# Patient Record
Sex: Female | Born: 1950
Health system: Southern US, Community
[De-identification: ages and names within clinical notes are randomized; demographics above are authoritative.]

## PROBLEM LIST (undated history)

## (undated) DIAGNOSIS — Z0000 Encounter for general adult medical examination without abnormal findings: Secondary | ICD-10-CM

## (undated) DIAGNOSIS — M1710 Unilateral primary osteoarthritis, unspecified knee: Secondary | ICD-10-CM

## (undated) DIAGNOSIS — R0609 Other forms of dyspnea: Secondary | ICD-10-CM

## (undated) DIAGNOSIS — I10 Essential (primary) hypertension: Secondary | ICD-10-CM

## (undated) DIAGNOSIS — Z1211 Encounter for screening for malignant neoplasm of colon: Secondary | ICD-10-CM

## (undated) HISTORY — DX: Encounter for screening for malignant neoplasm of colon: Z12.11

## (undated) HISTORY — PX: TUBAL LIGATION: SHX77

## (undated) HISTORY — DX: Encounter for general adult medical examination without abnormal findings: Z00.00

## (undated) HISTORY — DX: Other forms of dyspnea: R06.09

## (undated) HISTORY — PX: WISDOM TOOTH EXTRACTION: SHX21

## (undated) HISTORY — DX: Essential (primary) hypertension: I10

## (undated) HISTORY — DX: Unilateral primary osteoarthritis, unspecified knee: M17.10

---

## 2001-01-06 ENCOUNTER — Other Ambulatory Visit: Admission: RE | Admit: 2001-01-06 | Discharge: 2001-01-06 | Payer: Self-pay | Admitting: *Deleted

## 2003-09-29 ENCOUNTER — Inpatient Hospital Stay (HOSPITAL_COMMUNITY): Admission: AD | Admit: 2003-09-29 | Discharge: 2003-09-29 | Payer: Self-pay | Admitting: *Deleted

## 2011-09-17 ENCOUNTER — Other Ambulatory Visit: Payer: Self-pay | Admitting: Obstetrics and Gynecology

## 2011-09-17 DIAGNOSIS — Z1231 Encounter for screening mammogram for malignant neoplasm of breast: Secondary | ICD-10-CM

## 2011-09-19 ENCOUNTER — Ambulatory Visit (HOSPITAL_COMMUNITY): Payer: Self-pay | Attending: Obstetrics and Gynecology

## 2011-09-19 ENCOUNTER — Ambulatory Visit: Payer: Self-pay

## 2011-09-24 ENCOUNTER — Encounter: Payer: Self-pay | Admitting: Obstetrics and Gynecology

## 2012-02-10 ENCOUNTER — Other Ambulatory Visit: Payer: Self-pay | Admitting: Obstetrics and Gynecology

## 2012-02-10 DIAGNOSIS — Z1231 Encounter for screening mammogram for malignant neoplasm of breast: Secondary | ICD-10-CM

## 2012-02-23 ENCOUNTER — Encounter (HOSPITAL_COMMUNITY): Payer: Self-pay | Admitting: *Deleted

## 2012-03-02 ENCOUNTER — Ambulatory Visit (HOSPITAL_COMMUNITY): Payer: Self-pay

## 2012-03-16 ENCOUNTER — Encounter (HOSPITAL_COMMUNITY): Payer: Self-pay | Admitting: *Deleted

## 2012-03-23 ENCOUNTER — Ambulatory Visit (HOSPITAL_COMMUNITY): Payer: Self-pay | Attending: Obstetrics and Gynecology

## 2012-03-23 ENCOUNTER — Ambulatory Visit (HOSPITAL_COMMUNITY): Payer: Self-pay

## 2012-08-27 ENCOUNTER — Other Ambulatory Visit: Payer: Self-pay | Admitting: Obstetrics and Gynecology

## 2012-08-27 DIAGNOSIS — Z1231 Encounter for screening mammogram for malignant neoplasm of breast: Secondary | ICD-10-CM

## 2012-09-21 ENCOUNTER — Encounter (HOSPITAL_COMMUNITY): Payer: Self-pay

## 2012-09-21 ENCOUNTER — Ambulatory Visit (HOSPITAL_COMMUNITY)
Admission: RE | Admit: 2012-09-21 | Discharge: 2012-09-21 | Disposition: A | Discharge status: Home / Self Care | Payer: Self-pay | Source: Ambulatory Visit | Attending: Obstetrics and Gynecology | Admitting: Obstetrics and Gynecology

## 2012-09-21 VITALS — BP 150/98 | Temp 98.4°F | Ht 68.0 in | Wt 170.4 lb

## 2012-09-21 DIAGNOSIS — Z01419 Encounter for gynecological examination (general) (routine) without abnormal findings: Secondary | ICD-10-CM

## 2012-09-21 DIAGNOSIS — Z1231 Encounter for screening mammogram for malignant neoplasm of breast: Secondary | ICD-10-CM

## 2012-09-21 NOTE — Addendum Note (Signed)
Encounter addended by: Saintclair Halsted, RN on: 09/21/2012 10:45 AM<BR>     Documentation filed: Visit Diagnoses, Charges VN

## 2012-09-21 NOTE — Patient Instructions (Signed)
Taught patient how to perform BSE. Let her know [facility] will cover Pap smears every 3 years unless has a history of abnormal Pap smears. Let patient know will follow up with her within the next couple weeks with results by letter or phone. Patients BP was elevated 150/98. Patient educated on result and given resources to follow up. Patient verbalized understanding. Patient escorted to mammography for a screening mammogram.

## 2012-09-21 NOTE — Addendum Note (Signed)
Encounter addended by: Saintclair Halsted, RN on: 09/21/2012 12:48 PM<BR>     Documentation filed: Visit Diagnoses

## 2012-09-21 NOTE — Progress Notes (Signed)
No complaints today.  Pap Smear:    Pap smear completed today. Patients last Pap smear was 5 years ago and normal per patient. Per patient no history of an abnormal Pap smear. No Pap smear results in EPIC.  Physical exam: Breasts Breasts symmetrical. No skin abnormalities bilateral breasts. No nipple retraction bilateral breasts. No nipple discharge bilateral breasts. No lymphadenopathy. No lumps palpated bilateral breasts. No complaints of pain or tenderness on exam. Patient escorted to mammography for a screening mammogram.          Pelvic/Bimanual   Ext Genitalia No lesions, no swelling and no discharge observed on external genitalia.         Vagina Vagina pink and normal texture. No lesions or discharge observed in vagina.          Cervix Cervix is present. Cervix pink and of normal texture. No discharge observed.     Uterus Uterus is present and palpable. Uterus in normal position and normal size.        Adnexae Bilateral ovaries present and palpable. No tenderness on palpation.          Rectovaginal No rectal exam completed today since patient had no rectal complaints. No skin abnormalities observed on exam.

## 2012-09-29 ENCOUNTER — Telehealth (HOSPITAL_COMMUNITY): Payer: Self-pay | Admitting: *Deleted

## 2012-09-29 NOTE — Telephone Encounter (Signed)
Telephoned patient at home # and discussed negative results of pap smear. Next pap due in 3 years. Patient voiced understanding.

## 2012-11-29 ENCOUNTER — Ambulatory Visit (INDEPENDENT_AMBULATORY_CARE_PROVIDER_SITE_OTHER): Payer: BC Managed Care – PPO | Admitting: Internal Medicine

## 2012-11-29 ENCOUNTER — Ambulatory Visit (INDEPENDENT_AMBULATORY_CARE_PROVIDER_SITE_OTHER)
Admission: RE | Admit: 2012-11-29 | Discharge: 2012-11-29 | Disposition: A | Discharge status: Home / Self Care | Payer: BC Managed Care – PPO | Source: Ambulatory Visit | Attending: Internal Medicine | Admitting: Internal Medicine

## 2012-11-29 ENCOUNTER — Ambulatory Visit (INDEPENDENT_AMBULATORY_CARE_PROVIDER_SITE_OTHER): Payer: BC Managed Care – PPO

## 2012-11-29 ENCOUNTER — Encounter: Payer: Self-pay | Admitting: Internal Medicine

## 2012-11-29 VITALS — BP 140/88 | HR 79 | Temp 98.9°F | Resp 16 | Wt 172.0 lb

## 2012-11-29 DIAGNOSIS — M1710 Unilateral primary osteoarthritis, unspecified knee: Secondary | ICD-10-CM

## 2012-11-29 DIAGNOSIS — M25569 Pain in unspecified knee: Secondary | ICD-10-CM

## 2012-11-29 DIAGNOSIS — M25561 Pain in right knee: Secondary | ICD-10-CM

## 2012-11-29 DIAGNOSIS — Z23 Encounter for immunization: Secondary | ICD-10-CM

## 2012-11-29 DIAGNOSIS — Z0000 Encounter for general adult medical examination without abnormal findings: Secondary | ICD-10-CM

## 2012-11-29 DIAGNOSIS — M179 Osteoarthritis of knee, unspecified: Secondary | ICD-10-CM

## 2012-11-29 HISTORY — DX: Unilateral primary osteoarthritis, unspecified knee: M17.10

## 2012-11-29 HISTORY — DX: Encounter for general adult medical examination without abnormal findings: Z00.00

## 2012-11-29 HISTORY — DX: Osteoarthritis of knee, unspecified: M17.9

## 2012-11-29 LAB — COMPREHENSIVE METABOLIC PANEL
ALT: 19 U/L (ref 0–35)
AST: 21 U/L (ref 0–37)
Albumin: 4.3 g/dL (ref 3.5–5.2)
Alkaline Phosphatase: 77 U/L (ref 39–117)
BUN: 15 mg/dL (ref 6–23)
Calcium: 9.6 mg/dL (ref 8.4–10.5)
Chloride: 102 mEq/L (ref 96–112)
Potassium: 3.9 mEq/L (ref 3.5–5.1)

## 2012-11-29 LAB — CBC WITH DIFFERENTIAL/PLATELET
Basophils Absolute: 0.1 10*3/uL (ref 0.0–0.1)
Basophils Relative: 1.1 % (ref 0.0–3.0)
Eosinophils Absolute: 0.1 10*3/uL (ref 0.0–0.7)
Hemoglobin: 12.2 g/dL (ref 12.0–15.0)
Lymphocytes Relative: 25.5 % (ref 12.0–46.0)
MCHC: 32.8 g/dL (ref 30.0–36.0)
MCV: 75.2 fl — ABNORMAL LOW (ref 78.0–100.0)
Monocytes Absolute: 0.4 10*3/uL (ref 0.1–1.0)
Neutro Abs: 4.5 10*3/uL (ref 1.4–7.7)
Neutrophils Relative %: 66.1 % (ref 43.0–77.0)
RBC: 4.94 Mil/uL (ref 3.87–5.11)
RDW: 16.9 % — ABNORMAL HIGH (ref 11.5–14.6)

## 2012-11-29 LAB — LIPID PANEL
HDL: 77.3 mg/dL (ref >39.00)
Total CHOL/HDL Ratio: 3
VLDL: 25.2 mg/dL (ref 0.0–40.0)

## 2012-11-29 LAB — URINALYSIS, ROUTINE W REFLEX MICROSCOPIC
Hgb urine dipstick: NEGATIVE
Nitrite: NEGATIVE
Urobilinogen, UA: 0.2 (ref 0.0–1.0)

## 2012-11-29 LAB — TSH: TSH: 2.9 u[IU]/mL (ref 0.35–5.50)

## 2012-11-29 MED ORDER — PNEUMOCOCCAL VAC POLYVALENT 25 MCG/0.5ML IJ INJ
0.5000 mL | INJECTION | Freq: Once | INTRAMUSCULAR | Status: AC
  Administered 2012-11-29: 0.5 mL via INTRAMUSCULAR

## 2012-11-29 MED ORDER — PNEUMOCOCCAL VAC POLYVALENT 25 MCG/0.5ML IJ INJ: 0.5000 mL | INJECTION | INTRAMUSCULAR | Status: DC

## 2012-11-29 MED ORDER — TETANUS-DIPHTH-ACELL PERTUSSIS 5-2.5-18.5 LF-MCG/0.5 IM SUSP
0.5000 mL | Freq: Once | INTRAMUSCULAR | Status: AC
  Administered 2012-11-29: 0.5 mL via INTRAMUSCULAR

## 2012-11-29 NOTE — Assessment & Plan Note (Signed)
I will check a plain film and will advise further

## 2012-11-29 NOTE — Progress Notes (Signed)
  Subjective:    Patient ID: Stephanie Calderon, female    DOB: 1950-12-27, 62 y.o.   MRN: [id number]  Knee Pain  Incident onset: one year. The incident occurred at home. The injury mechanism was a fall. The pain is present in the right knee. The quality of the pain is described as aching. The pain is at a severity of 1/10. The pain is mild. The pain has been intermittent since onset. Pertinent negatives include no inability to bear weight, loss of motion, loss of sensation, muscle weakness, numbness or tingling. She reports no foreign bodies present. The symptoms are aggravated by weight bearing. She has tried nothing for the symptoms.      Review of Systems  Constitutional: Negative.   HENT: Negative.   Eyes: Negative.   Respiratory: Negative.  Negative for cough, chest tightness, shortness of breath, wheezing and stridor.   Cardiovascular: Negative.  Negative for chest pain, palpitations and leg swelling.  Gastrointestinal: Negative.  Negative for nausea, vomiting, abdominal pain, diarrhea, constipation and blood in stool.  Endocrine: Negative.   Genitourinary: Negative.   Musculoskeletal: Positive for arthralgias. Negative for myalgias, back pain, joint swelling and gait problem.  Skin: Negative.   Allergic/Immunologic: Negative.   Neurological: Negative.  Negative for dizziness, tingling, speech difficulty, weakness and numbness.  Hematological: Negative.  Negative for adenopathy. Does not bruise/bleed easily.  Psychiatric/Behavioral: Negative.        Objective:   Physical Exam  Vitals reviewed. Constitutional: She is oriented to person, place, and time. She appears well-developed and well-nourished. No distress.  HENT:  Head: Normocephalic and atraumatic.  Mouth/Throat: Oropharynx is clear and moist. No oropharyngeal exudate.  Eyes: Conjunctivae are normal. Right eye exhibits no discharge. Left eye exhibits no discharge. No scleral icterus.  Neck: Normal range of motion. Neck  supple. No JVD present. No tracheal deviation present. No thyromegaly present.  Cardiovascular: Normal rate, regular rhythm, normal heart sounds and intact distal pulses.  Exam reveals no gallop and no friction rub.   No murmur heard. Pulmonary/Chest: Effort normal and breath sounds normal. No stridor. No respiratory distress. She has no wheezes. She has no rales. She exhibits no tenderness.  Abdominal: Soft. Bowel sounds are normal. She exhibits no distension and no mass. There is no tenderness. There is no rebound and no guarding.  Musculoskeletal: Normal range of motion. She exhibits no edema and no tenderness.       Right knee: She exhibits normal range of motion, no swelling, no effusion, no ecchymosis, no deformity, no laceration, no erythema, normal alignment, no LCL laxity, normal patellar mobility, no bony tenderness, normal meniscus and no MCL laxity. Tenderness found. Medial joint line tenderness noted. No lateral joint line, no MCL, no LCL and no patellar tendon tenderness noted.  Lymphadenopathy:    She has no cervical adenopathy.  Neurological: She is oriented to person, place, and time.  Skin: Skin is warm and dry. No rash noted. She is not diaphoretic. No erythema. No pallor.  Psychiatric: She has a normal mood and affect. Her behavior is normal. Judgment and thought content normal.     No results found for this basename: WBC, HGB, HCT, PLT, GLUCOSE, CHOL, TRIG, HDL, LDLDIRECT, LDLCALC, ALT, AST, NA, K, CL, CREATININE, BUN, CO2, TSH, PSA, INR, GLUF, HGBA1C, MICROALBUR       Assessment & Plan:

## 2012-11-29 NOTE — Assessment & Plan Note (Signed)
Exam done Vaccines were updated Labs ordered She was referred for a colonoscopy Pt ed material was given

## 2012-11-29 NOTE — Patient Instructions (Signed)
Preventive Care for Adults, Female A healthy lifestyle and preventive care can promote health and wellness. Preventive health guidelines for women include the following key practices.  A routine yearly physical is a good way to check with your caregiver about your health and preventive screening. It is a chance to share any concerns and updates on your health, and to receive a thorough exam.  Visit your dentist for a routine exam and preventive care every 6 months. Brush your teeth twice a day and floss once a day. Good oral hygiene prevents tooth decay and gum disease.  The frequency of eye exams is based on your age, health, family medical history, use of contact lenses, and other factors. Follow your caregiver's recommendations for frequency of eye exams.  Eat a healthy diet. Foods like vegetables, fruits, whole grains, low-fat dairy products, and lean protein foods contain the nutrients you need without too many calories. Decrease your intake of foods high in solid fats, added sugars, and salt. Eat the right amount of calories for you.Get information about a proper diet from your caregiver, if necessary.  Regular physical exercise is one of the most important things you can do for your health. Most adults should get at least 150 minutes of moderate-intensity exercise (any activity that increases your heart rate and causes you to sweat) each week. In addition, most adults need muscle-strengthening exercises on 2 or more days a week.  Maintain a healthy weight. The body mass index (BMI) is a screening tool to identify possible weight problems. It provides an estimate of body fat based on height and weight. Your caregiver can help determine your BMI, and can help you achieve or maintain a healthy weight.For adults 20 years and older:  A BMI below 18.5 is considered underweight.  A BMI of 18.5 to 24.9 is normal.  A BMI of 25 to 29.9 is considered overweight.  A BMI of 30 and above is  considered obese.  Maintain normal blood lipids and cholesterol levels by exercising and minimizing your intake of saturated fat. Eat a balanced diet with plenty of fruit and vegetables. Blood tests for lipids and cholesterol should begin at age 20 and be repeated every 5 years. If your lipid or cholesterol levels are high, you are over 50, or you are at high risk for heart disease, you may need your cholesterol levels checked more frequently.Ongoing high lipid and cholesterol levels should be treated with medicines if diet and exercise are not effective.  If you smoke, find out from your caregiver how to quit. If you do not use tobacco, do not start.  If you are pregnant, do not drink alcohol. If you are breastfeeding, be very cautious about drinking alcohol. If you are not pregnant and choose to drink alcohol, do not exceed 1 drink per day. One drink is considered to be 12 ounces (355 mL) of beer, 5 ounces (148 mL) of wine, or 1.5 ounces (44 mL) of liquor.  Avoid use of street drugs. Do not share needles with anyone. Ask for help if you need support or instructions about stopping the use of drugs.  High blood pressure causes heart disease and increases the risk of stroke. Your blood pressure should be checked at least every 1 to 2 years. Ongoing high blood pressure should be treated with medicines if weight loss and exercise are not effective.  If you are 55 to 61 years old, ask your caregiver if you should take aspirin to prevent strokes.  Diabetes   screening involves taking a blood sample to check your fasting blood sugar level. This should be done once every 3 years, after age 45, if you are within normal weight and without risk factors for diabetes. Testing should be considered at a younger age or be carried out more frequently if you are overweight and have at least 1 risk factor for diabetes.  Breast cancer screening is essential preventive care for women. You should practice "breast  self-awareness." This means understanding the normal appearance and feel of your breasts and may include breast self-examination. Any changes detected, no matter how small, should be reported to a caregiver. Women in their 20s and 30s should have a clinical breast exam (CBE) by a caregiver as part of a regular health exam every 1 to 3 years. After age 40, women should have a CBE every year. Starting at age 40, women should consider having a mammography (breast X-ray test) every year. Women who have a family history of breast cancer should talk to their caregiver about genetic screening. Women at a high risk of breast cancer should talk to their caregivers about having magnetic resonance imaging (MRI) and a mammography every year.  The Pap test is a screening test for cervical cancer. A Pap test can show cell changes on the cervix that might become cervical cancer if left untreated. A Pap test is a procedure in which cells are obtained and examined from the lower end of the uterus (cervix).  Women should have a Pap test starting at age 21.  Between ages 21 and 29, Pap tests should be repeated every 2 years.  Beginning at age 30, you should have a Pap test every 3 years as long as the past 3 Pap tests have been normal.  Some women have medical problems that increase the chance of getting cervical cancer. Talk to your caregiver about these problems. It is especially important to talk to your caregiver if a new problem develops soon after your last Pap test. In these cases, your caregiver may recommend more frequent screening and Pap tests.  The above recommendations are the same for women who have or have not gotten the vaccine for human papillomavirus (HPV).  If you had a hysterectomy for a problem that was not cancer or a condition that could lead to cancer, then you no longer need Pap tests. Even if you no longer need a Pap test, a regular exam is a good idea to make sure no other problems are  starting.  If you are between ages 65 and 70, and you have had normal Pap tests going back 10 years, you no longer need Pap tests. Even if you no longer need a Pap test, a regular exam is a good idea to make sure no other problems are starting.  If you have had past treatment for cervical cancer or a condition that could lead to cancer, you need Pap tests and screening for cancer for at least 20 years after your treatment.  If Pap tests have been discontinued, risk factors (such as a new sexual partner) need to be reassessed to determine if screening should be resumed.  The HPV test is an additional test that may be used for cervical cancer screening. The HPV test looks for the virus that can cause the cell changes on the cervix. The cells collected during the Pap test can be tested for HPV. The HPV test could be used to screen women aged 30 years and older, and should   be used in women of any age who have unclear Pap test results. After the age of 30, women should have HPV testing at the same frequency as a Pap test.  Colorectal cancer can be detected and often prevented. Most routine colorectal cancer screening begins at the age of 50 and continues through age 75. However, your caregiver may recommend screening at an earlier age if you have risk factors for colon cancer. On a yearly basis, your caregiver may provide home test kits to check for hidden blood in the stool. Use of a small camera at the end of a tube, to directly examine the colon (sigmoidoscopy or colonoscopy), can detect the earliest forms of colorectal cancer. Talk to your caregiver about this at age 50, when routine screening begins. Direct examination of the colon should be repeated every 5 to 10 years through age 75, unless early forms of pre-cancerous polyps or small growths are found.  Hepatitis C blood testing is recommended for all people born from 1945 through 1965 and any individual with known risks for hepatitis C.  Practice  safe sex. Use condoms and avoid high-risk sexual practices to reduce the spread of sexually transmitted infections (STIs). STIs include gonorrhea, chlamydia, syphilis, trichomonas, herpes, HPV, and human immunodeficiency virus (HIV). Herpes, HIV, and HPV are viral illnesses that have no cure. They can result in disability, cancer, and death. Sexually active women aged 25 and younger should be checked for chlamydia. Older women with new or multiple partners should also be tested for chlamydia. Testing for other STIs is recommended if you are sexually active and at increased risk.  Osteoporosis is a disease in which the bones lose minerals and strength with aging. This can result in serious bone fractures. The risk of osteoporosis can be identified using a bone density scan. Women ages 65 and over and women at risk for fractures or osteoporosis should discuss screening with their caregivers. Ask your caregiver whether you should take a calcium supplement or vitamin D to reduce the rate of osteoporosis.  Menopause can be associated with physical symptoms and risks. Hormone replacement therapy is available to decrease symptoms and risks. You should talk to your caregiver about whether hormone replacement therapy is right for you.  Use sunscreen with sun protection factor (SPF) of 30 or more. Apply sunscreen liberally and repeatedly throughout the day. You should seek shade when your shadow is shorter than you. Protect yourself by wearing long sleeves, pants, a wide-brimmed hat, and sunglasses year round, whenever you are outdoors.  Once a month, do a whole body skin exam, using a mirror to look at the skin on your back. Notify your caregiver of new moles, moles that have irregular borders, moles that are larger than a pencil eraser, or moles that have changed in shape or color.  Stay current with required immunizations.  Influenza. You need a dose every fall (or winter). The composition of the flu vaccine  changes each year, so being vaccinated once is not enough.  Pneumococcal polysaccharide. You need 1 to 2 doses if you smoke cigarettes or if you have certain chronic medical conditions. You need 1 dose at age 65 (or older) if you have never been vaccinated.  Tetanus, diphtheria, pertussis (Tdap, Td). Get 1 dose of Tdap vaccine if you are younger than age 65, are over 65 and have contact with an infant, are a healthcare worker, are pregnant, or simply want to be protected from whooping cough. After that, you need a Td   booster dose every 10 years. Consult your caregiver if you have not had at least 3 tetanus and diphtheria-containing shots sometime in your life or have a deep or dirty wound.  HPV. You need this vaccine if you are a woman age 26 or younger. The vaccine is given in 3 doses over 6 months.  Measles, mumps, rubella (MMR). You need at least 1 dose of MMR if you were born in 1957 or later. You may also need a second dose.  Meningococcal. If you are age 19 to 21 and a first-year college student living in a residence hall, or have one of several medical conditions, you need to get vaccinated against meningococcal disease. You may also need additional booster doses.  Zoster (shingles). If you are age 60 or older, you should get this vaccine.  Varicella (chickenpox). If you have never had chickenpox or you were vaccinated but received only 1 dose, talk to your caregiver to find out if you need this vaccine.  Hepatitis A. You need this vaccine if you have a specific risk factor for hepatitis A virus infection or you simply wish to be protected from this disease. The vaccine is usually given as 2 doses, 6 to 18 months apart.  Hepatitis B. You need this vaccine if you have a specific risk factor for hepatitis B virus infection or you simply wish to be protected from this disease. The vaccine is given in 3 doses, usually over 6 months. Preventive Services / Frequency Ages 19 to 39  Blood  pressure check.** / Every 1 to 2 years.  Lipid and cholesterol check.** / Every 5 years beginning at age 20.  Clinical breast exam.** / Every 3 years for women in their 20s and 30s.  Pap test.** / Every 2 years from ages 21 through 29. Every 3 years starting at age 30 through age 65 or 70 with a history of 3 consecutive normal Pap tests.  HPV screening.** / Every 3 years from ages 30 through ages 65 to 70 with a history of 3 consecutive normal Pap tests.  Hepatitis C blood test.** / For any individual with known risks for hepatitis C.  Skin self-exam. / Monthly.  Influenza immunization.** / Every year.  Pneumococcal polysaccharide immunization.** / 1 to 2 doses if you smoke cigarettes or if you have certain chronic medical conditions.  Tetanus, diphtheria, pertussis (Tdap, Td) immunization. / A one-time dose of Tdap vaccine. After that, you need a Td booster dose every 10 years.  HPV immunization. / 3 doses over 6 months, if you are 26 and younger.  Measles, mumps, rubella (MMR) immunization. / You need at least 1 dose of MMR if you were born in 1957 or later. You may also need a second dose.  Meningococcal immunization. / 1 dose if you are age 19 to 21 and a first-year college student living in a residence hall, or have one of several medical conditions, you need to get vaccinated against meningococcal disease. You may also need additional booster doses.  Varicella immunization.** / Consult your caregiver.  Hepatitis A immunization.** / Consult your caregiver. 2 doses, 6 to 18 months apart.  Hepatitis B immunization.** / Consult your caregiver. 3 doses usually over 6 months. Ages 40 to 64  Blood pressure check.** / Every 1 to 2 years.  Lipid and cholesterol check.** / Every 5 years beginning at age 20.  Clinical breast exam.** / Every year after age 40.  Mammogram.** / Every year beginning at age 40   and continuing for as long as you are in good health. Consult with your  caregiver.  Pap test.** / Every 3 years starting at age 30 through age 65 or 70 with a history of 3 consecutive normal Pap tests.  HPV screening.** / Every 3 years from ages 30 through ages 65 to 70 with a history of 3 consecutive normal Pap tests.  Fecal occult blood test (FOBT) of stool. / Every year beginning at age 50 and continuing until age 75. You may not need to do this test if you get a colonoscopy every 10 years.  Flexible sigmoidoscopy or colonoscopy.** / Every 5 years for a flexible sigmoidoscopy or every 10 years for a colonoscopy beginning at age 50 and continuing until age 75.  Hepatitis C blood test.** / For all people born from 1945 through 1965 and any individual with known risks for hepatitis C.  Skin self-exam. / Monthly.  Influenza immunization.** / Every year.  Pneumococcal polysaccharide immunization.** / 1 to 2 doses if you smoke cigarettes or if you have certain chronic medical conditions.  Tetanus, diphtheria, pertussis (Tdap, Td) immunization.** / A one-time dose of Tdap vaccine. After that, you need a Td booster dose every 10 years.  Measles, mumps, rubella (MMR) immunization. / You need at least 1 dose of MMR if you were born in 1957 or later. You may also need a second dose.  Varicella immunization.** / Consult your caregiver.  Meningococcal immunization.** / Consult your caregiver.  Hepatitis A immunization.** / Consult your caregiver. 2 doses, 6 to 18 months apart.  Hepatitis B immunization.** / Consult your caregiver. 3 doses, usually over 6 months. Ages 65 and over  Blood pressure check.** / Every 1 to 2 years.  Lipid and cholesterol check.** / Every 5 years beginning at age 20.  Clinical breast exam.** / Every year after age 40.  Mammogram.** / Every year beginning at age 40 and continuing for as long as you are in good health. Consult with your caregiver.  Pap test.** / Every 3 years starting at age 30 through age 65 or 70 with a 3  consecutive normal Pap tests. Testing can be stopped between 65 and 70 with 3 consecutive normal Pap tests and no abnormal Pap or HPV tests in the past 10 years.  HPV screening.** / Every 3 years from ages 30 through ages 65 or 70 with a history of 3 consecutive normal Pap tests. Testing can be stopped between 65 and 70 with 3 consecutive normal Pap tests and no abnormal Pap or HPV tests in the past 10 years.  Fecal occult blood test (FOBT) of stool. / Every year beginning at age 50 and continuing until age 75. You may not need to do this test if you get a colonoscopy every 10 years.  Flexible sigmoidoscopy or colonoscopy.** / Every 5 years for a flexible sigmoidoscopy or every 10 years for a colonoscopy beginning at age 50 and continuing until age 75.  Hepatitis C blood test.** / For all people born from 1945 through 1965 and any individual with known risks for hepatitis C.  Osteoporosis screening.** / A one-time screening for women ages 65 and over and women at risk for fractures or osteoporosis.  Skin self-exam. / Monthly.  Influenza immunization.** / Every year.  Pneumococcal polysaccharide immunization.** / 1 dose at age 65 (or older) if you have never been vaccinated.  Tetanus, diphtheria, pertussis (Tdap, Td) immunization. / A one-time dose of Tdap vaccine if you are over   65 and have contact with an infant, are a healthcare worker, or simply want to be protected from whooping cough. After that, you need a Td booster dose every 10 years.  Varicella immunization.** / Consult your caregiver.  Meningococcal immunization.** / Consult your caregiver.  Hepatitis A immunization.** / Consult your caregiver. 2 doses, 6 to 18 months apart.  Hepatitis B immunization.** / Check with your caregiver. 3 doses, usually over 6 months. ** Family history and personal history of risk and conditions may change your caregiver's recommendations. Document Released: 06/24/2001 Document Revised: 07/21/2011  Document Reviewed: 09/23/2010 [employer] Patient Information 2014 [employer]  

## 2012-11-30 ENCOUNTER — Encounter: Payer: Self-pay | Admitting: Internal Medicine

## 2013-02-02 ENCOUNTER — Ambulatory Visit: Payer: BC Managed Care – PPO | Admitting: Internal Medicine

## 2013-02-03 ENCOUNTER — Encounter: Payer: Self-pay | Admitting: Gastroenterology

## 2013-02-10 ENCOUNTER — Encounter: Payer: Self-pay | Admitting: Internal Medicine

## 2013-02-10 ENCOUNTER — Ambulatory Visit (INDEPENDENT_AMBULATORY_CARE_PROVIDER_SITE_OTHER): Payer: BC Managed Care – PPO | Admitting: Internal Medicine

## 2013-02-10 VITALS — BP 118/82 | HR 72 | Temp 96.8°F | Resp 16 | Ht 68.0 in | Wt 173.0 lb

## 2013-02-10 DIAGNOSIS — Z0000 Encounter for general adult medical examination without abnormal findings: Secondary | ICD-10-CM

## 2013-02-10 DIAGNOSIS — M1710 Unilateral primary osteoarthritis, unspecified knee: Secondary | ICD-10-CM

## 2013-02-10 NOTE — Progress Notes (Signed)
  Subjective:    Patient ID: Stephanie Calderon, female    DOB: June 01, 1950, 62 y.o.   MRN: [id number]  Arthritis Presents for follow-up visit. The disease course has been improving. She complains of pain. She reports no stiffness, joint swelling or joint warmth. Affected locations include the right knee. Her pain is at a severity of 2/10. Pertinent negatives include no diarrhea, dry eyes, dry mouth, dysuria, fatigue, fever, pain at night, pain while resting, rash, Raynaud's syndrome, uveitis or weight loss. Her past medical history is significant for osteoarthritis. Her pertinent risk factors include overuse. Past treatments include acetaminophen and NSAIDs. The treatment provided significant relief. Factors aggravating her arthritis include activity. Compliance with prior treatments has been good.      Review of Systems  Constitutional: Negative for fever, weight loss and fatigue.  Gastrointestinal: Negative for diarrhea.  Genitourinary: Negative for dysuria.  Musculoskeletal: Positive for arthritis. Negative for joint swelling and stiffness.  Skin: Negative for rash.  All other systems reviewed and are negative.       Objective:   Physical Exam  Vitals reviewed. Constitutional: She is oriented to person, place, and time. She appears well-developed and well-nourished. No distress.  HENT:  Head: Normocephalic and atraumatic.  Mouth/Throat: Oropharynx is clear and moist. No oropharyngeal exudate.  Eyes: Conjunctivae are normal. Right eye exhibits no discharge. Left eye exhibits no discharge. No scleral icterus.  Neck: Normal range of motion. Neck supple. No JVD present. No tracheal deviation present. No thyromegaly present.  Cardiovascular: Normal rate, regular rhythm, normal heart sounds and intact distal pulses.  Exam reveals no gallop and no friction rub.   No murmur heard. Pulmonary/Chest: Effort normal and breath sounds normal. No stridor. No respiratory distress. She has no wheezes. She  has no rales. She exhibits no tenderness.  Abdominal: Soft. Bowel sounds are normal. She exhibits no distension and no mass. There is no tenderness. There is no rebound and no guarding.  Musculoskeletal: Normal range of motion. She exhibits no edema and no tenderness.       Right knee: She exhibits deformity (minimal changes c/w DJD). She exhibits normal range of motion, no swelling, no effusion, no ecchymosis, no laceration, no erythema, normal alignment, no LCL laxity, normal patellar mobility and no bony tenderness. No tenderness found.  Lymphadenopathy:    She has no cervical adenopathy.  Neurological: She is oriented to person, place, and time.  Skin: Skin is warm and dry. No rash noted. She is not diaphoretic. No erythema. No pallor.  Psychiatric: She has a normal mood and affect. Her behavior is normal. Judgment and thought content normal.          Assessment & Plan:

## 2013-02-10 NOTE — Assessment & Plan Note (Signed)
She is getting relief with aleve and apap

## 2013-02-10 NOTE — Patient Instructions (Signed)

## 2014-03-13 ENCOUNTER — Encounter: Payer: Self-pay | Admitting: Internal Medicine

## 2015-03-14 ENCOUNTER — Other Ambulatory Visit: Payer: Self-pay | Admitting: Obstetrics and Gynecology

## 2015-03-14 DIAGNOSIS — Z1231 Encounter for screening mammogram for malignant neoplasm of breast: Secondary | ICD-10-CM

## 2015-04-03 ENCOUNTER — Ambulatory Visit (HOSPITAL_COMMUNITY): Payer: Self-pay

## 2015-07-24 ENCOUNTER — Encounter: Payer: Self-pay | Admitting: Internal Medicine

## 2015-07-24 ENCOUNTER — Other Ambulatory Visit (INDEPENDENT_AMBULATORY_CARE_PROVIDER_SITE_OTHER): Payer: BLUE CROSS/BLUE SHIELD

## 2015-07-24 ENCOUNTER — Ambulatory Visit (INDEPENDENT_AMBULATORY_CARE_PROVIDER_SITE_OTHER): Payer: BLUE CROSS/BLUE SHIELD | Admitting: Internal Medicine

## 2015-07-24 VITALS — BP 160/100 | HR 66 | Temp 98.2°F | Resp 16 | Ht 68.0 in | Wt 170.0 lb

## 2015-07-24 DIAGNOSIS — R7989 Other specified abnormal findings of blood chemistry: Secondary | ICD-10-CM | POA: Diagnosis not present

## 2015-07-24 DIAGNOSIS — R0609 Other forms of dyspnea: Secondary | ICD-10-CM | POA: Insufficient documentation

## 2015-07-24 DIAGNOSIS — Z0000 Encounter for general adult medical examination without abnormal findings: Secondary | ICD-10-CM

## 2015-07-24 DIAGNOSIS — I10 Essential (primary) hypertension: Secondary | ICD-10-CM

## 2015-07-24 DIAGNOSIS — Z1231 Encounter for screening mammogram for malignant neoplasm of breast: Secondary | ICD-10-CM

## 2015-07-24 DIAGNOSIS — Z1211 Encounter for screening for malignant neoplasm of colon: Secondary | ICD-10-CM

## 2015-07-24 DIAGNOSIS — R0600 Dyspnea, unspecified: Secondary | ICD-10-CM

## 2015-07-24 HISTORY — DX: Dyspnea, unspecified: R06.00

## 2015-07-24 HISTORY — DX: Encounter for screening for malignant neoplasm of colon: Z12.11

## 2015-07-24 HISTORY — DX: Essential (primary) hypertension: I10

## 2015-07-24 HISTORY — DX: Other forms of dyspnea: R06.09

## 2015-07-24 LAB — LIPID PANEL
CHOLESTEROL: 212 mg/dL — AB (ref 0–200)
HDL: 81.4 mg/dL (ref >39.00)
NonHDL: 130.35
Total CHOL/HDL Ratio: 3
Triglycerides: 211 mg/dL — ABNORMAL HIGH (ref 0.0–149.0)
VLDL: 42.2 mg/dL — ABNORMAL HIGH (ref 0.0–40.0)

## 2015-07-24 LAB — URINALYSIS, ROUTINE W REFLEX MICROSCOPIC
BILIRUBIN URINE: NEGATIVE
HGB URINE DIPSTICK: NEGATIVE
KETONES UR: NEGATIVE
LEUKOCYTES UA: NEGATIVE
Nitrite: NEGATIVE
PH: 6 (ref 5.0–8.0)
RBC / HPF: NONE SEEN (ref 0–?)
Specific Gravity, Urine: <=1.005 — AB (ref 1.000–1.030)
TOTAL PROTEIN, URINE-UPE24: NEGATIVE
UROBILINOGEN UA: 0.2 (ref 0.0–1.0)
Urine Glucose: NEGATIVE
WBC UA: NONE SEEN (ref 0–?)

## 2015-07-24 LAB — COMPREHENSIVE METABOLIC PANEL
ALBUMIN: 4.7 g/dL (ref 3.5–5.2)
ALK PHOS: 66 U/L (ref 39–117)
ALT: 14 U/L (ref 0–35)
AST: 15 U/L (ref 0–37)
BILIRUBIN TOTAL: 0.5 mg/dL (ref 0.2–1.2)
BUN: 15 mg/dL (ref 6–23)
CALCIUM: 9.9 mg/dL (ref 8.4–10.5)
CHLORIDE: 104 meq/L (ref 96–112)
CO2: 26 mEq/L (ref 19–32)
CREATININE: 0.97 mg/dL (ref 0.40–1.20)
GFR: 74.47 mL/min (ref >60.00)
Glucose, Bld: 104 mg/dL — ABNORMAL HIGH (ref 70–99)
Potassium: 4.5 mEq/L (ref 3.5–5.1)
SODIUM: 139 meq/L (ref 135–145)
TOTAL PROTEIN: 8.2 g/dL (ref 6.0–8.3)

## 2015-07-24 LAB — CBC WITH DIFFERENTIAL/PLATELET
BASOS ABS: 0.1 10*3/uL (ref 0.0–0.1)
BASOS PCT: 1.1 % (ref 0.0–3.0)
EOS ABS: 0.1 10*3/uL (ref 0.0–0.7)
Eosinophils Relative: 1.9 % (ref 0.0–5.0)
HCT: 39.5 % (ref 36.0–46.0)
HEMOGLOBIN: 12.9 g/dL (ref 12.0–15.0)
LYMPHS PCT: 38.7 % (ref 12.0–46.0)
Lymphs Abs: 2.9 10*3/uL (ref 0.7–4.0)
MCHC: 32.6 g/dL (ref 30.0–36.0)
MCV: 74.8 fl — ABNORMAL LOW (ref 78.0–100.0)
MONO ABS: 0.5 10*3/uL (ref 0.1–1.0)
Monocytes Relative: 6.4 % (ref 3.0–12.0)
Neutro Abs: 3.9 10*3/uL (ref 1.4–7.7)
Neutrophils Relative %: 51.9 % (ref 43.0–77.0)
Platelets: 323 10*3/uL (ref 150.0–400.0)
RBC: 5.28 Mil/uL — ABNORMAL HIGH (ref 3.87–5.11)
RDW: 16.5 % — AB (ref 11.5–15.5)
WBC: 7.6 10*3/uL (ref 4.0–10.5)

## 2015-07-24 LAB — BRAIN NATRIURETIC PEPTIDE: PRO B NATRI PEPTIDE: 35 pg/mL (ref 0.0–100.0)

## 2015-07-24 LAB — TSH: TSH: 3.75 u[IU]/mL (ref 0.35–4.50)

## 2015-07-24 LAB — LDL CHOLESTEROL, DIRECT: Direct LDL: 81 mg/dL

## 2015-07-24 MED ORDER — HYDROCHLOROTHIAZIDE 25 MG PO TABS: 25.0000 mg | ORAL_TABLET | Freq: Every day | ORAL | Status: DC

## 2015-07-24 NOTE — Progress Notes (Signed)
Subjective:  Patient ID: Stephanie Calderon, female    DOB: 1950/07/20  Age: 65 y.o. MRN: [id number]  CC: Annual Exam and Hypertension   HPI Stephanie Calderon presents for a CPX.   She complains of dyspnea on exertion for 1 year and is concerned that her blood per blood pressure has been elevated. She denies chest pain, diaphoresis, nausea, dizziness, syncope, palpitations.  No outpatient prescriptions prior to visit.   No facility-administered medications prior to visit.    ROS Review of Systems  Constitutional: Negative.  Negative for fever, chills, diaphoresis, activity change, appetite change, fatigue and unexpected weight change.  HENT: Negative.   Eyes: Negative.  Negative for visual disturbance.  Respiratory: Positive for shortness of breath. Negative for apnea, cough, choking, chest tightness, wheezing and stridor.   Cardiovascular: Negative.  Negative for chest pain, palpitations and leg swelling.  Gastrointestinal: Negative.  Negative for nausea, vomiting, abdominal pain, diarrhea, constipation and blood in stool.  Endocrine: Negative.   Genitourinary: Negative.  Negative for dysuria, frequency, hematuria and difficulty urinating.  Musculoskeletal: Negative.  Negative for back pain, joint swelling, arthralgias, neck pain and neck stiffness.  Skin: Negative.  Negative for color change, pallor and rash.  Allergic/Immunologic: Negative.   Neurological: Negative.  Negative for dizziness, tremors, facial asymmetry, weakness, light-headedness, numbness and headaches.  Hematological: Negative.  Negative for adenopathy. Does not bruise/bleed easily.  Psychiatric/Behavioral: Negative.     Objective:  BP 160/100 mmHg  Pulse 66  Temp(Src) 98.2 F (36.8 C) (Oral)  Resp 16  Ht  (1.727 m)  Wt 170 lb (77.111 kg)  BMI 25.85 kg/m2  SpO2 99%  BP Readings from Last 3 Encounters:  07/24/15 160/100  02/10/13 118/82  11/29/12 140/88    Wt Readings from Last 3 Encounters:    07/24/15 170 lb (77.111 kg)  02/10/13 173 lb (78.472 kg)  11/29/12 172 lb (78.019 kg)    Physical Exam  Constitutional: She is oriented to person, place, and time. She appears well-developed and well-nourished. No distress.  HENT:  Head: Normocephalic and atraumatic.  Mouth/Throat: Oropharynx is clear and moist. No oropharyngeal exudate.  Eyes: Conjunctivae are normal. Right eye exhibits no discharge. Left eye exhibits no discharge. No scleral icterus.  Neck: Normal range of motion. Neck supple. No JVD present. No tracheal deviation present. No thyromegaly present.  Cardiovascular: Normal rate, regular rhythm, normal heart sounds and intact distal pulses.  Exam reveals no gallop and no friction rub.   No murmur heard. Pulses:      Carotid pulses are 1+ on the right side, and 1+ on the left side.      Radial pulses are 1+ on the right side, and 1+ on the left side.       Femoral pulses are 1+ on the right side, and 1+ on the left side.      Popliteal pulses are 1+ on the right side, and 1+ on the left side.       Dorsalis pedis pulses are 1+ on the right side, and 1+ on the left side.       Posterior tibial pulses are 1+ on the right side, and 1+ on the left side.  EKG --  Sinus  Rhythm  WITHIN NORMAL LIMITS  Pulmonary/Chest: Effort normal and breath sounds normal. No stridor. No respiratory distress. She has no wheezes. She has no rales. She exhibits no tenderness.  Abdominal: Soft. Bowel sounds are normal. She exhibits no distension and no mass. There is  no tenderness. There is no rebound and no guarding.  Musculoskeletal: Normal range of motion. She exhibits no edema or tenderness.  Lymphadenopathy:    She has no cervical adenopathy.  Neurological: She is oriented to person, place, and time.  Skin: Skin is warm and dry. No rash noted. She is not diaphoretic. No erythema. No pallor.  Psychiatric: She has a normal mood and affect. Her behavior is normal. Judgment and thought content  normal.  Vitals reviewed.   Lab Results  Component Value Date   WBC 7.6 07/24/2015   HGB 12.9 07/24/2015   HCT 39.5 07/24/2015   PLT 323.0 07/24/2015   GLUCOSE 104* 07/24/2015   CHOL 212* 07/24/2015   TRIG 211.0* 07/24/2015   HDL 81.40 07/24/2015   LDLDIRECT 81.0 07/24/2015   ALT 14 07/24/2015   AST 15 07/24/2015   NA 139 07/24/2015   K 4.5 07/24/2015   CL 104 07/24/2015   CREATININE 0.97 07/24/2015   BUN 15 07/24/2015   CO2 26 07/24/2015   TSH 3.75 07/24/2015    Dg Knee Complete 4 Views Right  11/29/2012  *RADIOLOGY REPORT* Clinical Data: Fall 1 year ago.  Pain. RIGHT KNEE - COMPLETE 4+ VIEW Comparison: None. Findings: No acute bony abnormality.  Specifically, no fracture, subluxation, or dislocation.  Soft tissues are intact. Joint spaces are maintained.  Normal bone mineralization.  No joint effusion. IMPRESSION: No acute bony abnormality. Original Report Authenticated By: Charlett Nose, M.D.    Assessment & Plan:   Travis was seen today for annual exam and hypertension.  Diagnoses and all orders for this visit:  Routine general medical examination at a health care facility- she refused a flu vaccine, she has been referred for screening colonoscopy and mammogram, her PAP smear is up-to-date, exam completed, labs ordered and reviewed, she was given patient education material. -     Lipid panel; Future -     Comprehensive metabolic panel; Future -     CBC with Differential/Platelet; Future -     Urinalysis, Routine w reflex microscopic (not at [facility]); Future -     TSH; Future -     Hepatitis C antibody; Future -     HIV antibody; Future  DOE (dyspnea on exertion)- her EKG is normal, labs show no secondary causes for shortness of breath and her brain natruretic peptide is normal so I don't think she has congestive heart failure, I've asked her to undergo an exercise treadmill test to screen for ischemia. -     Brain natriuretic peptide; Future -     EKG 12-Lead -      Exercise Tolerance Test; Future  Essential hypertension- she has new onset hypertension, her labs show no evidence of secondary causes, her EKG is negative for left ventricular hypertrophy, will start a thiazide diuretic to treat this. -     hydrochlorothiazide (HYDRODIURIL) 25 MG tablet; Take 1 tablet (25 mg total) by mouth daily.  Screen for colon cancer -     Ambulatory referral to Gastroenterology  Visit for screening mammogram -     MM DIGITAL SCREENING BILATERAL; Future  I am having Ms. Sauve start on hydrochlorothiazide. I am also having her maintain her aspirin EC.  Meds ordered this encounter  Medications  . aspirin EC 81 MG tablet    Sig: Take 81 mg by mouth daily.  . hydrochlorothiazide (HYDRODIURIL) 25 MG tablet    Sig: Take 1 tablet (25 mg total) by mouth daily.    Dispense:  90 tablet    Refill:  3     Follow-up: Return in about 4 weeks (around 08/21/2015).  Sanda Lingerhomas Kerem Gilmer, MD

## 2015-07-24 NOTE — Patient Instructions (Signed)
Preventive Care for Adults, Female A healthy lifestyle and preventive care can promote health and wellness. Preventive health guidelines for women include the following key practices.  A routine yearly physical is a good way to check with your health care provider about your health and preventive screening. It is a chance to share any concerns and updates on your health and to receive a thorough exam.  Visit your dentist for a routine exam and preventive care every 6 months. Brush your teeth twice a day and floss once a day. Good oral hygiene prevents tooth decay and gum disease.  The frequency of eye exams is based on your age, health, family medical history, use of contact lenses, and other factors. Follow your health care provider's recommendations for frequency of eye exams.  Eat a healthy diet. Foods like vegetables, fruits, whole grains, low-fat dairy products, and lean protein foods contain the nutrients you need without too many calories. Decrease your intake of foods high in solid fats, added sugars, and salt. Eat the right amount of calories for you.Get information about a proper diet from your health care provider, if necessary.  Regular physical exercise is one of the most important things you can do for your health. Most adults should get at least 150 minutes of moderate-intensity exercise (any activity that increases your heart rate and causes you to sweat) each week. In addition, most adults need muscle-strengthening exercises on 2 or more days a week.  Maintain a healthy weight. The body mass index (BMI) is a screening tool to identify possible weight problems. It provides an estimate of body fat based on height and weight. Your health care provider can find your BMI and can help you achieve or maintain a healthy weight.For adults 20 years and older:  A BMI below 18.5 is considered underweight.  A BMI of 18.5 to 24.9 is normal.  A BMI of 25 to 29.9 is considered overweight.  A  BMI of 30 and above is considered obese.  Maintain normal blood lipids and cholesterol levels by exercising and minimizing your intake of saturated fat. Eat a balanced diet with plenty of fruit and vegetables. Blood tests for lipids and cholesterol should begin at age 45 and be repeated every 5 years. If your lipid or cholesterol levels are high, you are over 50, or you are at high risk for heart disease, you may need your cholesterol levels checked more frequently.Ongoing high lipid and cholesterol levels should be treated with medicines if diet and exercise are not working.  If you smoke, find out from your health care provider how to quit. If you do not use tobacco, do not start.  Lung cancer screening is recommended for adults aged 45-80 years who are at high risk for developing lung cancer because of a history of smoking. A yearly low-dose CT scan of the lungs is recommended for people who have at least a 30-pack-year history of smoking and are a current smoker or have quit within the past 15 years. A pack year of smoking is smoking an average of 1 pack of cigarettes a day for 1 year (for example: 1 pack a day for 30 years or 2 packs a day for 15 years). Yearly screening should continue until the smoker has stopped smoking for at least 15 years. Yearly screening should be stopped for people who develop a health problem that would prevent them from having lung cancer treatment.  If you are pregnant, do not drink alcohol. If you are  breastfeeding, be very cautious about drinking alcohol. If you are not pregnant and choose to drink alcohol, do not have more than 1 drink per day. One drink is considered to be 12 ounces (355 mL) of beer, 5 ounces (148 mL) of wine, or 1.5 ounces (44 mL) of liquor.  Avoid use of street drugs. Do not share needles with anyone. Ask for help if you need support or instructions about stopping the use of drugs.  High blood pressure causes heart disease and increases the risk  of stroke. Your blood pressure should be checked at least every 1 to 2 years. Ongoing high blood pressure should be treated with medicines if weight loss and exercise do not work.  If you are 55-79 years old, ask your health care provider if you should take aspirin to prevent strokes.  Diabetes screening is done by taking a blood sample to check your blood glucose level after you have not eaten for a certain period of time (fasting). If you are not overweight and you do not have risk factors for diabetes, you should be screened once every 3 years starting at age 45. If you are overweight or obese and you are 40-70 years of age, you should be screened for diabetes every year as part of your cardiovascular risk assessment.  Breast cancer screening is essential preventive care for women. You should practice "breast self-awareness." This means understanding the normal appearance and feel of your breasts and may include breast self-examination. Any changes detected, no matter how small, should be reported to a health care provider. Women in their 20s and 30s should have a clinical breast exam (CBE) by a health care provider as part of a regular health exam every 1 to 3 years. After age 40, women should have a CBE every year. Starting at age 40, women should consider having a mammogram (breast X-ray test) every year. Women who have a family history of breast cancer should talk to their health care provider about genetic screening. Women at a high risk of breast cancer should talk to their health care providers about having an MRI and a mammogram every year.  Breast cancer gene (BRCA)-related cancer risk assessment is recommended for women who have family members with BRCA-related cancers. BRCA-related cancers include breast, ovarian, tubal, and peritoneal cancers. Having family members with these cancers may be associated with an increased risk for harmful changes (mutations) in the breast cancer genes BRCA1 and  BRCA2. Results of the assessment will determine the need for genetic counseling and BRCA1 and BRCA2 testing.  Your health care provider may recommend that you be screened regularly for cancer of the pelvic organs (ovaries, uterus, and vagina). This screening involves a pelvic examination, including checking for microscopic changes to the surface of your cervix (Pap test). You may be encouraged to have this screening done every 3 years, beginning at age 21.  For women ages 30-65, health care providers may recommend pelvic exams and Pap testing every 3 years, or they may recommend the Pap and pelvic exam, combined with testing for human papilloma virus (HPV), every 5 years. Some types of HPV increase your risk of cervical cancer. Testing for HPV may also be done on women of any age with unclear Pap test results.  Other health care providers may not recommend any screening for nonpregnant women who are considered low risk for pelvic cancer and who do not have symptoms. Ask your health care provider if a screening pelvic exam is right for   you.  If you have had past treatment for cervical cancer or a condition that could lead to cancer, you need Pap tests and screening for cancer for at least 20 years after your treatment. If Pap tests have been discontinued, your risk factors (such as having a new sexual partner) need to be reassessed to determine if screening should resume. Some women have medical problems that increase the chance of getting cervical cancer. In these cases, your health care provider may recommend more frequent screening and Pap tests.  Colorectal cancer can be detected and often prevented. Most routine colorectal cancer screening begins at the age of 50 years and continues through age 75 years. However, your health care provider may recommend screening at an earlier age if you have risk factors for colon cancer. On a yearly basis, your health care provider may provide home test kits to check  for hidden blood in the stool. Use of a small camera at the end of a tube, to directly examine the colon (sigmoidoscopy or colonoscopy), can detect the earliest forms of colorectal cancer. Talk to your health care provider about this at age 50, when routine screening begins. Direct exam of the colon should be repeated every 5-10 years through age 75 years, unless early forms of precancerous polyps or small growths are found.  People who are at an increased risk for hepatitis B should be screened for this virus. You are considered at high risk for hepatitis B if:  You were born in a country where hepatitis B occurs often. Talk with your health care provider about which countries are considered high risk.  Your parents were born in a high-risk country and you have not received a shot to protect against hepatitis B (hepatitis B vaccine).  You have HIV or AIDS.  You use needles to inject street drugs.  You live with, or have sex with, someone who has hepatitis B.  You get hemodialysis treatment.  You take certain medicines for conditions like cancer, organ transplantation, and autoimmune conditions.  Hepatitis C blood testing is recommended for all people born from 1945 through 1965 and any individual with known risks for hepatitis C.  Practice safe sex. Use condoms and avoid high-risk sexual practices to reduce the spread of sexually transmitted infections (STIs). STIs include gonorrhea, chlamydia, syphilis, trichomonas, herpes, HPV, and human immunodeficiency virus (HIV). Herpes, HIV, and HPV are viral illnesses that have no cure. They can result in disability, cancer, and death.  You should be screened for sexually transmitted illnesses (STIs) including gonorrhea and chlamydia if:  You are sexually active and are younger than 24 years.  You are older than 24 years and your health care provider tells you that you are at risk for this type of infection.  Your sexual activity has changed  since you were last screened and you are at an increased risk for chlamydia or gonorrhea. Ask your health care provider if you are at risk.  If you are at risk of being infected with HIV, it is recommended that you take a prescription medicine daily to prevent HIV infection. This is called preexposure prophylaxis (PrEP). You are considered at risk if:  You are sexually active and do not regularly use condoms or know the HIV status of your partner(s).  You take drugs by injection.  You are sexually active with a partner who has HIV.  Talk with your health care provider about whether you are at high risk of being infected with HIV. If   you choose to begin PrEP, you should first be tested for HIV. You should then be tested every 3 months for as long as you are taking PrEP.  Osteoporosis is a disease in which the bones lose minerals and strength with aging. This can result in serious bone fractures or breaks. The risk of osteoporosis can be identified using a bone density scan. Women ages 67 years and over and women at risk for fractures or osteoporosis should discuss screening with their health care providers. Ask your health care provider whether you should take a calcium supplement or vitamin D to reduce the rate of osteoporosis.  Menopause can be associated with physical symptoms and risks. Hormone replacement therapy is available to decrease symptoms and risks. You should talk to your health care provider about whether hormone replacement therapy is right for you.  Use sunscreen. Apply sunscreen liberally and repeatedly throughout the day. You should seek shade when your shadow is shorter than you. Protect yourself by wearing long sleeves, pants, a wide-brimmed hat, and sunglasses year round, whenever you are outdoors.  Once a month, do a whole body skin exam, using a mirror to look at the skin on your back. Tell your health care provider of new moles, moles that have irregular borders, moles that  are larger than a pencil eraser, or moles that have changed in shape or color.  Stay current with required vaccines (immunizations).  Influenza vaccine. All adults should be immunized every year.  Tetanus, diphtheria, and acellular pertussis (Td, Tdap) vaccine. Pregnant women should receive 1 dose of Tdap vaccine during each pregnancy. The dose should be obtained regardless of the length of time since the last dose. Immunization is preferred during the 27th-36th week of gestation. An adult who has not previously received Tdap or who does not know her vaccine status should receive 1 dose of Tdap. This initial dose should be followed by tetanus and diphtheria toxoids (Td) booster doses every 10 years. Adults with an unknown or incomplete history of completing a 3-dose immunization series with Td-containing vaccines should begin or complete a primary immunization series including a Tdap dose. Adults should receive a Td booster every 10 years.  Varicella vaccine. An adult without evidence of immunity to varicella should receive 2 doses or a second dose if she has previously received 1 dose. Pregnant females who do not have evidence of immunity should receive the first dose after pregnancy. This first dose should be obtained before leaving the health care facility. The second dose should be obtained 4-8 weeks after the first dose.  Human papillomavirus (HPV) vaccine. Females aged 13-26 years who have not received the vaccine previously should obtain the 3-dose series. The vaccine is not recommended for use in pregnant females. However, pregnancy testing is not needed before receiving a dose. If a female is found to be pregnant after receiving a dose, no treatment is needed. In that case, the remaining doses should be delayed until after the pregnancy. Immunization is recommended for any person with an immunocompromised condition through the age of 61 years if she did not get any or all doses earlier. During the  3-dose series, the second dose should be obtained 4-8 weeks after the first dose. The third dose should be obtained 24 weeks after the first dose and 16 weeks after the second dose.  Zoster vaccine. One dose is recommended for adults aged 30 years or older unless certain conditions are present.  Measles, mumps, and rubella (MMR) vaccine. Adults born  before 1957 generally are considered immune to measles and mumps. Adults born in 1957 or later should have 1 or more doses of MMR vaccine unless there is a contraindication to the vaccine or there is laboratory evidence of immunity to each of the three diseases. A routine second dose of MMR vaccine should be obtained at least 28 days after the first dose for students attending postsecondary schools, health care workers, or international travelers. People who received inactivated measles vaccine or an unknown type of measles vaccine during 1963-1967 should receive 2 doses of MMR vaccine. People who received inactivated mumps vaccine or an unknown type of mumps vaccine before 1979 and are at high risk for mumps infection should consider immunization with 2 doses of MMR vaccine. For females of childbearing age, rubella immunity should be determined. If there is no evidence of immunity, females who are not pregnant should be vaccinated. If there is no evidence of immunity, females who are pregnant should delay immunization until after pregnancy. Unvaccinated health care workers born before 1957 who lack laboratory evidence of measles, mumps, or rubella immunity or laboratory confirmation of disease should consider measles and mumps immunization with 2 doses of MMR vaccine or rubella immunization with 1 dose of MMR vaccine.  Pneumococcal 13-valent conjugate (PCV13) vaccine. When indicated, a person who is uncertain of his immunization history and has no record of immunization should receive the PCV13 vaccine. All adults 65 years of age and older should receive this  vaccine. An adult aged 19 years or older who has certain medical conditions and has not been previously immunized should receive 1 dose of PCV13 vaccine. This PCV13 should be followed with a dose of pneumococcal polysaccharide (PPSV23) vaccine. Adults who are at high risk for pneumococcal disease should obtain the PPSV23 vaccine at least 8 weeks after the dose of PCV13 vaccine. Adults older than 65 years of age who have normal immune system function should obtain the PPSV23 vaccine dose at least 1 year after the dose of PCV13 vaccine.  Pneumococcal polysaccharide (PPSV23) vaccine. When PCV13 is also indicated, PCV13 should be obtained first. All adults aged 65 years and older should be immunized. An adult younger than age 65 years who has certain medical conditions should be immunized. Any person who resides in a nursing home or long-term care facility should be immunized. An adult smoker should be immunized. People with an immunocompromised condition and certain other conditions should receive both PCV13 and PPSV23 vaccines. People with human immunodeficiency virus (HIV) infection should be immunized as soon as possible after diagnosis. Immunization during chemotherapy or radiation therapy should be avoided. Routine use of PPSV23 vaccine is not recommended for American Indians, [place] Natives, or people younger than 65 years unless there are medical conditions that require PPSV23 vaccine. When indicated, people who have unknown immunization and have no record of immunization should receive PPSV23 vaccine. One-time revaccination 5 years after the first dose of PPSV23 is recommended for people aged 19-64 years who have chronic kidney failure, nephrotic syndrome, asplenia, or immunocompromised conditions. People who received 1-2 doses of PPSV23 before age 65 years should receive another dose of PPSV23 vaccine at age 65 years or later if at least 5 years have passed since the previous dose. Doses of PPSV23 are not  needed for people immunized with PPSV23 at or after age 65 years.  Meningococcal vaccine. Adults with asplenia or persistent complement component deficiencies should receive 2 doses of quadrivalent meningococcal conjugate (MenACWY-D) vaccine. The doses should be obtained   at least 2 months apart. Microbiologists working with certain meningococcal bacteria, [place] recruits, people at risk during an outbreak, and people who travel to or live in countries with a high rate of meningitis should be immunized. A first-year college student up through age 34 years who is living in a residence hall should receive a dose if she did not receive a dose on or after her 16th birthday. Adults who have certain high-risk conditions should receive one or more doses of vaccine.  Hepatitis A vaccine. Adults who wish to be protected from this disease, have certain high-risk conditions, work with hepatitis A-infected animals, work in hepatitis A research labs, or travel to or work in countries with a high rate of hepatitis A should be immunized. Adults who were previously unvaccinated and who anticipate close contact with an international adoptee during the first 60 days after arrival in the [place] from a country with a high rate of hepatitis A should be immunized.  Hepatitis B vaccine. Adults who wish to be protected from this disease, have certain high-risk conditions, may be exposed to blood or other infectious body fluids, are household contacts or sex partners of hepatitis B positive people, are clients or workers in certain care facilities, or travel to or work in countries with a high rate of hepatitis B should be immunized.  Haemophilus influenzae type b (Hib) vaccine. A previously unvaccinated person with asplenia or sickle cell disease or having a scheduled splenectomy should receive 1 dose of Hib vaccine. Regardless of previous immunization, a recipient of a hematopoietic stem cell transplant should receive a  3-dose series 6-12 months after her successful transplant. Hib vaccine is not recommended for adults with HIV infection. Preventive Services / Frequency Ages 35 to 4 years  Blood pressure check.** / Every 3-5 years.  Lipid and cholesterol check.** / Every 5 years beginning at age 60.  Clinical breast exam.** / Every 3 years for women in their 71s and 10s.  BRCA-related cancer risk assessment.** / For women who have family members with a BRCA-related cancer (breast, ovarian, tubal, or peritoneal cancers).  Pap test.** / Every 2 years from ages 76 through 26. Every 3 years starting at age 61 through age 76 or 93 with a history of 3 consecutive normal Pap tests.  HPV screening.** / Every 3 years from ages 37 through ages 60 to 51 with a history of 3 consecutive normal Pap tests.  Hepatitis C blood test.** / For any individual with known risks for hepatitis C.  Skin self-exam. / Monthly.  Influenza vaccine. / Every year.  Tetanus, diphtheria, and acellular pertussis (Tdap, Td) vaccine.** / Consult your health care provider. Pregnant women should receive 1 dose of Tdap vaccine during each pregnancy. 1 dose of Td every 10 years.  Varicella vaccine.** / Consult your health care provider. Pregnant females who do not have evidence of immunity should receive the first dose after pregnancy.  HPV vaccine. / 3 doses over 6 months, if 93 and younger. The vaccine is not recommended for use in pregnant females. However, pregnancy testing is not needed before receiving a dose.  Measles, mumps, rubella (MMR) vaccine.** / You need at least 1 dose of MMR if you were born in 1957 or later. You may also need a 2nd dose. For females of childbearing age, rubella immunity should be determined. If there is no evidence of immunity, females who are not pregnant should be vaccinated. If there is no evidence of immunity, females who are  pregnant should delay immunization until after pregnancy.  Pneumococcal  13-valent conjugate (PCV13) vaccine.** / Consult your health care provider.  Pneumococcal polysaccharide (PPSV23) vaccine.** / 1 to 2 doses if you smoke cigarettes or if you have certain conditions.  Meningococcal vaccine.** / 1 dose if you are age 68 to 8 years and a Market researcher living in a residence hall, or have one of several medical conditions, you need to get vaccinated against meningococcal disease. You may also need additional booster doses.  Hepatitis A vaccine.** / Consult your health care provider.  Hepatitis B vaccine.** / Consult your health care provider.  Haemophilus influenzae type b (Hib) vaccine.** / Consult your health care provider. Ages 7 to 53 years  Blood pressure check.** / Every year.  Lipid and cholesterol check.** / Every 5 years beginning at age 25 years.  Lung cancer screening. / Every year if you are aged 11-80 years and have a 30-pack-year history of smoking and currently smoke or have quit within the past 15 years. Yearly screening is stopped once you have quit smoking for at least 15 years or develop a health problem that would prevent you from having lung cancer treatment.  Clinical breast exam.** / Every year after age 48 years.  BRCA-related cancer risk assessment.** / For women who have family members with a BRCA-related cancer (breast, ovarian, tubal, or peritoneal cancers).  Mammogram.** / Every year beginning at age 41 years and continuing for as long as you are in good health. Consult with your health care provider.  Pap test.** / Every 3 years starting at age 65 years through age 37 or 70 years with a history of 3 consecutive normal Pap tests.  HPV screening.** / Every 3 years from ages 72 years through ages 60 to 40 years with a history of 3 consecutive normal Pap tests.  Fecal occult blood test (FOBT) of stool. / Every year beginning at age 21 years and continuing until age 5 years. You may not need to do this test if you get  a colonoscopy every 10 years.  Flexible sigmoidoscopy or colonoscopy.** / Every 5 years for a flexible sigmoidoscopy or every 10 years for a colonoscopy beginning at age 35 years and continuing until age 48 years.  Hepatitis C blood test.** / For all people born from [birth year] through 1965 and any individual with known risks for hepatitis C.  Skin self-exam. / Monthly.  Influenza vaccine. / Every year.  Tetanus, diphtheria, and acellular pertussis (Tdap/Td) vaccine.** / Consult your health care provider. Pregnant women should receive 1 dose of Tdap vaccine during each pregnancy. 1 dose of Td every 10 years.  Varicella vaccine.** / Consult your health care provider. Pregnant females who do not have evidence of immunity should receive the first dose after pregnancy.  Zoster vaccine.** / 1 dose for adults aged 30 years or older.  Measles, mumps, rubella (MMR) vaccine.** / You need at least 1 dose of MMR if you were born in 1957 or later. You may also need a second dose. For females of childbearing age, rubella immunity should be determined. If there is no evidence of immunity, females who are not pregnant should be vaccinated. If there is no evidence of immunity, females who are pregnant should delay immunization until after pregnancy.  Pneumococcal 13-valent conjugate (PCV13) vaccine.** / Consult your health care provider.  Pneumococcal polysaccharide (PPSV23) vaccine.** / 1 to 2 doses if you smoke cigarettes or if you have certain conditions.  Meningococcal vaccine.** /  Consult your health care provider.  Hepatitis A vaccine.** / Consult your health care provider.  Hepatitis B vaccine.** / Consult your health care provider.  Haemophilus influenzae type b (Hib) vaccine.** / Consult your health care provider. Ages 64 years and over  Blood pressure check.** / Every year.  Lipid and cholesterol check.** / Every 5 years beginning at age 23 years.  Lung cancer screening. / Every year if you  are aged 16-80 years and have a 30-pack-year history of smoking and currently smoke or have quit within the past 15 years. Yearly screening is stopped once you have quit smoking for at least 15 years or develop a health problem that would prevent you from having lung cancer treatment.  Clinical breast exam.** / Every year after age 74 years.  BRCA-related cancer risk assessment.** / For women who have family members with a BRCA-related cancer (breast, ovarian, tubal, or peritoneal cancers).  Mammogram.** / Every year beginning at age 44 years and continuing for as long as you are in good health. Consult with your health care provider.  Pap test.** / Every 3 years starting at age 58 years through age 22 or 39 years with 3 consecutive normal Pap tests. Testing can be stopped between 65 and 70 years with 3 consecutive normal Pap tests and no abnormal Pap or HPV tests in the past 10 years.  HPV screening.** / Every 3 years from ages 64 years through ages 70 or 61 years with a history of 3 consecutive normal Pap tests. Testing can be stopped between 65 and 70 years with 3 consecutive normal Pap tests and no abnormal Pap or HPV tests in the past 10 years.  Fecal occult blood test (FOBT) of stool. / Every year beginning at age 40 years and continuing until age 27 years. You may not need to do this test if you get a colonoscopy every 10 years.  Flexible sigmoidoscopy or colonoscopy.** / Every 5 years for a flexible sigmoidoscopy or every 10 years for a colonoscopy beginning at age 7 years and continuing until age 32 years.  Hepatitis C blood test.** / For all people born from [birth year] through 1964 and any individual with known risks for hepatitis C.  Osteoporosis screening.** / A one-time screening for women ages 30 years and over and women at risk for fractures or osteoporosis.  Skin self-exam. / Monthly.  Influenza vaccine. / Every year.  Tetanus, diphtheria, and acellular pertussis (Tdap/Td)  vaccine.** / 1 dose of Td every 10 years.  Varicella vaccine.** / Consult your health care provider.  Zoster vaccine.** / 1 dose for adults aged 35 years or older.  Pneumococcal 13-valent conjugate (PCV13) vaccine.** / Consult your health care provider.  Pneumococcal polysaccharide (PPSV23) vaccine.** / 1 dose for all adults aged 46 years and older.  Meningococcal vaccine.** / Consult your health care provider.  Hepatitis A vaccine.** / Consult your health care provider.  Hepatitis B vaccine.** / Consult your health care provider.  Haemophilus influenzae type b (Hib) vaccine.** / Consult your health care provider. ** Family history and personal history of risk and conditions may change your health care provider's recommendations.   This information is not intended to replace advice given to you by your health care provider. Make sure you discuss any questions you have with your health care provider.   Document Released: 06/24/2001 Document Revised: 05/19/2014 Document Reviewed: 09/23/2010 Elsevier Interactive Patient Education [employer].

## 2015-07-24 NOTE — Progress Notes (Signed)
Pre visit review using our clinic review tool, if applicable. No additional management support is needed unless otherwise documented below in the visit note. 

## 2015-07-25 ENCOUNTER — Encounter: Payer: Self-pay | Admitting: Internal Medicine

## 2015-07-25 LAB — HIV ANTIBODY (ROUTINE TESTING W REFLEX): HIV 1&2 Ab, 4th Generation: NONREACTIVE

## 2015-07-25 LAB — HEPATITIS C ANTIBODY: HCV Ab: NEGATIVE

## 2015-07-26 ENCOUNTER — Encounter: Payer: Self-pay | Admitting: Internal Medicine

## 2015-08-07 ENCOUNTER — Ambulatory Visit: Payer: BLUE CROSS/BLUE SHIELD

## 2015-08-09 ENCOUNTER — Ambulatory Visit (INDEPENDENT_AMBULATORY_CARE_PROVIDER_SITE_OTHER): Payer: BLUE CROSS/BLUE SHIELD

## 2015-08-09 DIAGNOSIS — R0609 Other forms of dyspnea: Secondary | ICD-10-CM | POA: Diagnosis not present

## 2015-08-09 LAB — EXERCISE TOLERANCE TEST
CHL CUP MPHR: 157 {beats}/min
CSEPEW: 4.6 METS
CSEPHR: 94 %
CSEPPHR: 148 {beats}/min
Exercise duration (min): 2 min
Exercise duration (sec): 16 s
RPE: 19
Rest HR: 78 {beats}/min

## 2015-08-13 ENCOUNTER — Other Ambulatory Visit: Payer: Self-pay | Admitting: Internal Medicine

## 2015-08-13 DIAGNOSIS — R0609 Other forms of dyspnea: Principal | ICD-10-CM

## 2015-08-14 ENCOUNTER — Other Ambulatory Visit: Payer: Self-pay | Admitting: Internal Medicine

## 2015-08-14 DIAGNOSIS — R0609 Other forms of dyspnea: Principal | ICD-10-CM

## 2015-08-15 ENCOUNTER — Ambulatory Visit
Admission: RE | Admit: 2015-08-15 | Discharge: 2015-08-15 | Disposition: A | Discharge status: Home / Self Care | Payer: BLUE CROSS/BLUE SHIELD | Source: Ambulatory Visit | Attending: Internal Medicine | Admitting: Internal Medicine

## 2015-08-15 DIAGNOSIS — Z1231 Encounter for screening mammogram for malignant neoplasm of breast: Secondary | ICD-10-CM

## 2015-08-15 LAB — HM MAMMOGRAPHY

## 2015-08-29 ENCOUNTER — Ambulatory Visit: Payer: BLUE CROSS/BLUE SHIELD | Admitting: Internal Medicine

## 2015-09-10 ENCOUNTER — Telehealth (HOSPITAL_COMMUNITY): Payer: Self-pay | Admitting: *Deleted

## 2015-09-10 NOTE — Telephone Encounter (Signed)
Patient given detailed instructions per Myocardial Perfusion Study Information Sheet for the test on 09/12/15. Patient notified to arrive 15 minutes early and that it is imperative to arrive on time for appointment to keep from having the test rescheduled.  If you need to cancel or reschedule your appointment, please call the office within 24 hours of your appointment. Failure to do so may result in a cancellation of your appointment, and a $50 no show fee. Patient verbalized understanding.Taryll Reichenberger J Mylasia Vorhees, RN  

## 2015-09-11 ENCOUNTER — Encounter: Payer: Self-pay | Admitting: Gastroenterology

## 2015-09-11 ENCOUNTER — Ambulatory Visit (AMBULATORY_SURGERY_CENTER): Payer: Self-pay

## 2015-09-11 VITALS — Ht 67.0 in | Wt 169.0 lb

## 2015-09-11 DIAGNOSIS — Z1211 Encounter for screening for malignant neoplasm of colon: Secondary | ICD-10-CM

## 2015-09-11 NOTE — Progress Notes (Signed)
Per pt, no allergies to soy or egg products.Pt not taking any weight loss meds or using  O2 at home. 

## 2015-09-12 ENCOUNTER — Encounter (HOSPITAL_COMMUNITY): Payer: BLUE CROSS/BLUE SHIELD

## 2015-09-20 ENCOUNTER — Telehealth (HOSPITAL_COMMUNITY): Payer: Self-pay | Admitting: *Deleted

## 2015-09-20 NOTE — Telephone Encounter (Signed)
Patient given detailed instructions per Myocardial Perfusion Study Information Sheet for the test on 09/26/15 at 1000. Patient notified to arrive 15 minutes early and that it is imperative to arrive on time for appointment to keep from having the test rescheduled.  If you need to cancel or reschedule your appointment, please call the office within 24 hours of your appointment. Failure to do so may result in a cancellation of your appointment, and a $50 no show fee. Patient verbalized understanding.Taira Knabe W    

## 2015-09-24 ENCOUNTER — Encounter: Payer: BLUE CROSS/BLUE SHIELD | Admitting: Internal Medicine

## 2015-09-24 ENCOUNTER — Encounter: Payer: BLUE CROSS/BLUE SHIELD | Admitting: Gastroenterology

## 2015-09-26 ENCOUNTER — Ambulatory Visit (HOSPITAL_COMMUNITY): Payer: BLUE CROSS/BLUE SHIELD | Attending: Cardiology

## 2015-09-26 DIAGNOSIS — R0609 Other forms of dyspnea: Secondary | ICD-10-CM | POA: Diagnosis not present

## 2015-09-26 DIAGNOSIS — I10 Essential (primary) hypertension: Secondary | ICD-10-CM | POA: Insufficient documentation

## 2015-09-26 DIAGNOSIS — R9439 Abnormal result of other cardiovascular function study: Secondary | ICD-10-CM | POA: Diagnosis not present

## 2015-09-26 DIAGNOSIS — Z8249 Family history of ischemic heart disease and other diseases of the circulatory system: Secondary | ICD-10-CM | POA: Insufficient documentation

## 2015-09-26 LAB — MYOCARDIAL PERFUSION IMAGING
CHL CUP RESTING HR STRESS: 55 {beats}/min
CSEPPHR: 78 {beats}/min
LV dias vol: 112 mL (ref 46–106)
LVSYSVOL: 44 mL
NUC STRESS TID: 1.14
RATE: 0.21
SDS: 1
SRS: 0
SSS: 1

## 2015-09-26 MED ORDER — REGADENOSON 0.4 MG/5ML IV SOLN
0.4000 mg | Freq: Once | INTRAVENOUS | Status: AC
  Administered 2015-09-26: 0.4 mg via INTRAVENOUS

## 2015-09-26 MED ORDER — TECHNETIUM TC 99M TETROFOSMIN IV KIT
10.8000 | PACK | Freq: Once | INTRAVENOUS | Status: AC | PRN
  Administered 2015-09-26: 11 via INTRAVENOUS
  Filled 2015-09-26: qty 11

## 2015-09-26 MED ORDER — TECHNETIUM TC 99M TETROFOSMIN IV KIT
32.7000 | PACK | Freq: Once | INTRAVENOUS | Status: AC | PRN
  Administered 2015-09-26: 32.7 via INTRAVENOUS
  Filled 2015-09-26: qty 33

## 2016-06-10 ENCOUNTER — Ambulatory Visit: Payer: BLUE CROSS/BLUE SHIELD | Admitting: Internal Medicine

## 2016-06-16 ENCOUNTER — Ambulatory Visit (INDEPENDENT_AMBULATORY_CARE_PROVIDER_SITE_OTHER): Payer: BLUE CROSS/BLUE SHIELD | Admitting: Internal Medicine

## 2016-06-16 ENCOUNTER — Ambulatory Visit (INDEPENDENT_AMBULATORY_CARE_PROVIDER_SITE_OTHER)
Admission: RE | Admit: 2016-06-16 | Discharge: 2016-06-16 | Disposition: A | Discharge status: Home / Self Care | Payer: BLUE CROSS/BLUE SHIELD | Source: Ambulatory Visit | Attending: Internal Medicine | Admitting: Internal Medicine

## 2016-06-16 ENCOUNTER — Encounter: Payer: Self-pay | Admitting: Internal Medicine

## 2016-06-16 VITALS — BP 160/90 | HR 61 | Temp 98.3°F | Resp 16 | Ht 67.0 in | Wt 161.2 lb

## 2016-06-16 DIAGNOSIS — S62102A Fracture of unspecified carpal bone, left wrist, initial encounter for closed fracture: Secondary | ICD-10-CM

## 2016-06-16 DIAGNOSIS — S6992XA Unspecified injury of left wrist, hand and finger(s), initial encounter: Secondary | ICD-10-CM

## 2016-06-16 NOTE — Progress Notes (Signed)
Subjective:  Patient ID: Stephanie Calderon, female    DOB: 1950-07-03  Age: 66 y.o. MRN: [id number]  CC: Wrist Injury   HPI Stephanie Calderon presents for Left wrist injury. She tells me that she fell 3 weeks ago and has persistent pain and swelling in her left wrist.  Outpatient Medications Prior to Visit  Medication Sig Dispense Refill  . aspirin EC 81 MG tablet Take 81 mg by mouth daily.    . bisacodyl (DULCOLAX) 5 MG EC tablet Take 5 mg by mouth. Dulcolax 5 mg bowel prep #4-Take as directed    . hydrochlorothiazide (HYDRODIURIL) 25 MG tablet Take 1 tablet (25 mg total) by mouth daily. 90 tablet 3  . polyethylene glycol powder (MIRALAX) powder Take 1 Container by mouth. Miralax bowel prep 238 gms-Take as directed     No facility-administered medications prior to visit.     ROS Review of Systems  All other systems reviewed and are negative.   Objective:  BP (!) 160/90 (BP Location: Left Arm, Patient Position: Sitting, Cuff Size: Normal)   Pulse 61   Temp 98.3 F (36.8 C) (Oral)   Resp 16   Ht 5\' 7"  (1.702 m)   Wt 161 lb 4 oz (73.1 kg)   SpO2 96%   BMI 25.26 kg/m   BP Readings from Last 3 Encounters:  06/16/16 (!) 160/90  07/24/15 (!) 160/100  02/10/13 118/82    Wt Readings from Last 3 Encounters:  06/16/16 161 lb 4 oz (73.1 kg)  09/11/15 169 lb (76.7 kg)  07/24/15 170 lb (77.1 kg)    Physical Exam  Musculoskeletal:       Left wrist: She exhibits decreased range of motion, tenderness, bony tenderness and swelling. She exhibits no effusion and no deformity.       Left hand: Normal. She exhibits normal range of motion, no tenderness, no bony tenderness, normal two-point discrimination, normal capillary refill, no deformity, no laceration and no swelling.  Dorsum of left wrist shows mild swelling and tenderness to palpation over the distal radius. There is also mild decrease in the range of motion.    Lab Results  Component Value Date   WBC 7.6 07/24/2015   HGB 12.9  07/24/2015   HCT 39.5 07/24/2015   PLT 323.0 07/24/2015   GLUCOSE 104 (H) 07/24/2015   CHOL 212 (H) 07/24/2015   TRIG 211.0 (H) 07/24/2015   HDL 81.40 07/24/2015   LDLDIRECT 81.0 07/24/2015   ALT 14 07/24/2015   AST 15 07/24/2015   NA 139 07/24/2015   K 4.5 07/24/2015   CL 104 07/24/2015   CREATININE 0.97 07/24/2015   BUN 15 07/24/2015   CO2 26 07/24/2015   TSH 3.75 07/24/2015    Mm Digital Screening Bilateral  Result Date: 08/15/2015 CLINICAL DATA:  Screening. EXAM: DIGITAL SCREENING BILATERAL MAMMOGRAM WITH CAD COMPARISON:  Previous exam(s). ACR Breast Density Category b: There are scattered areas of fibroglandular density. FINDINGS: There are no findings suspicious for malignancy. Images were processed with CAD. IMPRESSION: No mammographic evidence of malignancy. A result letter of this screening mammogram will be mailed directly to the patient. RECOMMENDATION: Screening mammogram in one year. (Code:SM-B-01Y) BI-RADS CATEGORY  1: Negative. Electronically Signed   By: Norva Pavlov M.D.   On: 08/15/2015 16:59    Dg Wrist Complete Left  Result Date: 06/16/2016 CLINICAL DATA:  Fall 2 weeks ago.  Wrist pain. EXAM: LEFT WRIST - COMPLETE 3+ VIEW COMPARISON:  None. FINDINGS: There is a transverse fracture,  mildly comminuted, across the distal radial metaphysis. No significant fracture displacement. There is slight impaction with no significant dorsal angulation of the articular surface. There is mild loss of normal radial inclination. No other fractures.  The joints are normally spaced and aligned. There is surrounding soft tissue edema. IMPRESSION: Nondisplaced transverse fracture across the distal radial metaphysis as described. Electronically Signed   By: Amie Portlandavid  Ormond M.D.   On: 06/16/2016 17:15    Assessment & Plan:   Stephanie Calderon was seen today for wrist injury.  Diagnoses and all orders for this visit:  Left wrist injury, initial encounter- plain film is positive for nondisplaced  fracture over the distal radius -     DG Wrist Complete Left; Future -     Ambulatory referral to Orthopedic Surgery  Closed fracture of left wrist, initial encounter- will refer to orthopedics to see if she needs to be immobilized and/or considered for surgical repair. -     Ambulatory referral to Orthopedic Surgery   I am having Stephanie Calderon maintain her aspirin EC, hydrochlorothiazide, polyethylene glycol powder, and bisacodyl.  No orders of the defined types were placed in this encounter.    Follow-up: Return if symptoms worsen or fail to improve.  Stephanie Lingerhomas Ajai Harville, MD

## 2016-06-16 NOTE — Patient Instructions (Signed)
Wrist Pain, Adult There are many things that can cause wrist pain. Some common causes include:  An injury to the wrist area, such as a sprain, strain, or fracture.  Overuse of the joint.  A condition that causes increased pressure on a nerve in the wrist (carpal tunnel syndrome).  Wear and tear of the joints that occurs with aging (osteoarthritis).  A variety of other types of arthritis.  Sometimes, the cause of wrist pain is not known. Often, the pain goes away when you follow instructions from your health care provider for relieving pain at home, such as resting or icing the wrist. If your wrist pain continues, it is important to tell your health care provider. Follow these instructions at home:  Rest the wrist area for at least 48 hours or as long as told by your health care provider.  If a splint or elastic bandage has been applied, use it as told by your health care provider. ? Remove the splint or bandage only as told by your health care provider. ? Loosen the splint or bandage if your fingers tingle, become numb, or turn cold or blue.  If directed, apply ice to the injured area. ? If you have a removable splint or elastic bandage, remove it as told by your health care provider. ? Put ice in a plastic bag. ? Place a towel between your skin and the bag or between your splint or bandage and the bag. ? Leave the ice on for 20 minutes, 2-3 times a day.  Keep your arm raised (elevated) above the level of your heart while you are sitting or lying down.  Take over-the-counter and prescription medicines only as told by your health care provider.  Keep all follow-up visits as told by your health care provider. This is important. Contact a health care provider if:  You have a sudden sharp pain in the wrist, hand, or arm that is different or new.  The swelling or bruising on your wrist or hand gets worse.  Your skin becomes red, gets a rash, or has open sores.  Your pain does not  get better or it gets worse. Get help right away if:  You lose feeling in your fingers or hand.  Your fingers turn white, very red, or cold and blue.  You cannot move your fingers.  You have a fever or chills. This information is not intended to replace advice given to you by your health care provider. Make sure you discuss any questions you have with your health care provider. Document Released: 02/05/2005 Document Revised: 11/22/2015 Document Reviewed: 11/15/2015 Elsevier Interactive Patient Education  2017 [employer]  

## 2016-06-16 NOTE — Progress Notes (Signed)
Pre visit review using our clinic review tool, if applicable. No additional management support is needed unless otherwise documented below in the visit note. 

## 2016-07-22 ENCOUNTER — Other Ambulatory Visit: Payer: Self-pay | Admitting: Internal Medicine

## 2016-07-22 DIAGNOSIS — I10 Essential (primary) hypertension: Secondary | ICD-10-CM

## 2016-09-10 ENCOUNTER — Encounter: Payer: Self-pay | Admitting: Internal Medicine

## 2016-09-10 ENCOUNTER — Ambulatory Visit (INDEPENDENT_AMBULATORY_CARE_PROVIDER_SITE_OTHER): Payer: BLUE CROSS/BLUE SHIELD | Admitting: Internal Medicine

## 2016-09-10 ENCOUNTER — Other Ambulatory Visit (INDEPENDENT_AMBULATORY_CARE_PROVIDER_SITE_OTHER): Payer: BLUE CROSS/BLUE SHIELD

## 2016-09-10 VITALS — BP 124/84 | HR 83 | Temp 98.0°F | Resp 16 | Ht 67.0 in | Wt 157.5 lb

## 2016-09-10 DIAGNOSIS — I10 Essential (primary) hypertension: Secondary | ICD-10-CM

## 2016-09-10 DIAGNOSIS — Z0000 Encounter for general adult medical examination without abnormal findings: Secondary | ICD-10-CM

## 2016-09-10 DIAGNOSIS — Z1211 Encounter for screening for malignant neoplasm of colon: Secondary | ICD-10-CM

## 2016-09-10 LAB — URINALYSIS, ROUTINE W REFLEX MICROSCOPIC
Bilirubin Urine: NEGATIVE
Hgb urine dipstick: NEGATIVE
Ketones, ur: NEGATIVE
Leukocytes, UA: NEGATIVE
Nitrite: NEGATIVE
RBC / HPF: NONE SEEN (ref 0–?)
Specific Gravity, Urine: 1.025 (ref 1.000–1.030)
Total Protein, Urine: NEGATIVE
Urine Glucose: NEGATIVE
Urobilinogen, UA: 0.2 (ref 0.0–1.0)
pH: 5.5 (ref 5.0–8.0)

## 2016-09-10 LAB — LIPID PANEL
Cholesterol: 166 mg/dL (ref 0–200)
HDL: 68.3 mg/dL (ref >39.00)
LDL Cholesterol: 74 mg/dL (ref 0–99)
NonHDL: 97.89
Total CHOL/HDL Ratio: 2
Triglycerides: 118 mg/dL (ref 0.0–149.0)
VLDL: 23.6 mg/dL (ref 0.0–40.0)

## 2016-09-10 LAB — CBC WITH DIFFERENTIAL/PLATELET
Basophils Absolute: 0.1 10*3/uL (ref 0.0–0.1)
Basophils Relative: 0.8 % (ref 0.0–3.0)
Eosinophils Absolute: 0.2 10*3/uL (ref 0.0–0.7)
Eosinophils Relative: 2.6 % (ref 0.0–5.0)
HEMATOCRIT: 37.6 % (ref 36.0–46.0)
Hemoglobin: 12.1 g/dL (ref 12.0–15.0)
LYMPHS ABS: 2.5 10*3/uL (ref 0.7–4.0)
LYMPHS PCT: 37.7 % (ref 12.0–46.0)
MCHC: 32.2 g/dL (ref 30.0–36.0)
MCV: 74.6 fl — AB (ref 78.0–100.0)
MONOS PCT: 8.9 % (ref 3.0–12.0)
Monocytes Absolute: 0.6 10*3/uL (ref 0.1–1.0)
NEUTROS ABS: 3.3 10*3/uL (ref 1.4–7.7)
NEUTROS PCT: 50 % (ref 43.0–77.0)
PLATELETS: 293 10*3/uL (ref 150.0–400.0)
RBC: 5.04 Mil/uL (ref 3.87–5.11)
RDW: 16 % — ABNORMAL HIGH (ref 11.5–15.5)
WBC: 6.7 10*3/uL (ref 4.0–10.5)

## 2016-09-10 LAB — COMPREHENSIVE METABOLIC PANEL
ALT: 12 U/L (ref 0–35)
AST: 13 U/L (ref 0–37)
Albumin: 4 g/dL (ref 3.5–5.2)
Alkaline Phosphatase: 60 U/L (ref 39–117)
BUN: 19 mg/dL (ref 6–23)
CO2: 28 mEq/L (ref 19–32)
Calcium: 9.4 mg/dL (ref 8.4–10.5)
Chloride: 106 mEq/L (ref 96–112)
Creatinine, Ser: 0.88 mg/dL (ref 0.40–1.20)
GFR: 83.02 mL/min (ref >60.00)
Glucose, Bld: 83 mg/dL (ref 70–99)
Potassium: 4.1 mEq/L (ref 3.5–5.1)
Sodium: 139 mEq/L (ref 135–145)
Total Bilirubin: 0.4 mg/dL (ref 0.2–1.2)
Total Protein: 7.1 g/dL (ref 6.0–8.3)

## 2016-09-10 LAB — TSH: TSH: 3.24 u[IU]/mL (ref 0.35–4.50)

## 2016-09-10 NOTE — Progress Notes (Signed)
Pre visit review using our clinic review tool, if applicable. No additional management support is needed unless otherwise documented below in the visit note. 

## 2016-09-10 NOTE — Progress Notes (Signed)
Subjective:  Patient ID: Stephanie Calderon, female    DOB: 08/17/1950  Age: 66 y.o. MRN: [id number]  CC: Annual Exam and Hypertension   HPI Stephanie Calderon presents for a CPX.  Her blood pressure has been well controlled on hydrochlorothiazide. She is active and denies eating any recent episodes of DOE or chest pain. She denies fatigue, palpitations, edema, or diaphoresis.  Outpatient Medications Prior to Visit  Medication Sig Dispense Refill  . aspirin EC 81 MG tablet Take 81 mg by mouth daily.    . hydrochlorothiazide (HYDRODIURIL) 25 MG tablet Take 1 tablet (25 mg total) by mouth daily. 90 tablet 3  . bisacodyl (DULCOLAX) 5 MG EC tablet Take 5 mg by mouth. Dulcolax 5 mg bowel prep #4-Take as directed    . polyethylene glycol powder (MIRALAX) powder Take 1 Container by mouth. Miralax bowel prep 238 gms-Take as directed     No facility-administered medications prior to visit.     ROS Review of Systems  Constitutional: Negative for appetite change, diaphoresis, fatigue and unexpected weight change.  HENT: Negative.   Eyes: Negative for visual disturbance.  Respiratory: Negative for cough, chest tightness, shortness of breath and wheezing.   Cardiovascular: Negative for chest pain, palpitations and leg swelling.  Gastrointestinal: Negative for abdominal pain, constipation, diarrhea, nausea and vomiting.  Endocrine: Negative.   Genitourinary: Negative.  Negative for difficulty urinating.  Musculoskeletal: Negative.  Negative for back pain and myalgias.  Skin: Negative.  Negative for color change and rash.  Allergic/Immunologic: Negative.   Neurological: Negative.  Negative for dizziness, weakness and light-headedness.  Hematological: Negative for adenopathy. Does not bruise/bleed easily.  Psychiatric/Behavioral: Negative.     Objective:  BP 124/84 (BP Location: Left Arm, Patient Position: Sitting, Cuff Size: Normal)   Pulse 83   Temp 98 F (36.7 C) (Oral)   Resp 16   Ht   (1.702 m)   Wt 157 lb 8 oz (71.4 kg)   SpO2 100%   BMI 24.67 kg/m   BP Readings from Last 3 Encounters:  09/10/16 124/84  06/16/16 (!) 160/90  07/24/15 (!) 160/100    Wt Readings from Last 3 Encounters:  09/10/16 157 lb 8 oz (71.4 kg)  06/16/16 161 lb 4 oz (73.1 kg)  09/11/15 169 lb (76.7 kg)    Physical Exam  Constitutional: She is oriented to person, place, and time. No distress.  HENT:  Mouth/Throat: Oropharynx is clear and moist. No oropharyngeal exudate.  Eyes: Conjunctivae are normal. Right eye exhibits no discharge. Left eye exhibits no discharge. No scleral icterus.  Neck: Normal range of motion. Neck supple. No JVD present. No tracheal deviation present. No thyromegaly present.  Cardiovascular: Normal rate, regular rhythm, normal heart sounds and intact distal pulses.  Exam reveals no friction rub.   No murmur heard. Pulmonary/Chest: Effort normal and breath sounds normal. No stridor. No respiratory distress. She has no wheezes. She has no rales. She exhibits no tenderness.  Abdominal: Soft. Bowel sounds are normal. She exhibits no distension and no mass. There is no tenderness. There is no rebound and no guarding.  Musculoskeletal: Normal range of motion. She exhibits no edema, tenderness or deformity.  Lymphadenopathy:    She has no cervical adenopathy.  Neurological: She is oriented to person, place, and time.  Skin: Skin is warm and dry. No rash noted. She is not diaphoretic. No erythema. No pallor.  Vitals reviewed.   Lab Results  Component Value Date   WBC 6.7 09/10/2016  HGB 12.1 09/10/2016   HCT 37.6 09/10/2016   PLT 293.0 09/10/2016   GLUCOSE 83 09/10/2016   CHOL 166 09/10/2016   TRIG 118.0 09/10/2016   HDL 68.30 09/10/2016   LDLDIRECT 81.0 07/24/2015   LDLCALC 74 09/10/2016   ALT 12 09/10/2016   AST 13 09/10/2016   NA 139 09/10/2016   K 4.1 09/10/2016   CL 106 09/10/2016   CREATININE 0.88 09/10/2016   BUN 19 09/10/2016   CO2 28 09/10/2016    TSH 3.24 09/10/2016    Dg Wrist Complete Left  Result Date: 06/16/2016 CLINICAL DATA:  Fall 2 weeks ago.  Wrist pain. EXAM: LEFT WRIST - COMPLETE 3+ VIEW COMPARISON:  None. FINDINGS: There is a transverse fracture, mildly comminuted, across the distal radial metaphysis. No significant fracture displacement. There is slight impaction with no significant dorsal angulation of the articular surface. There is mild loss of normal radial inclination. No other fractures.  The joints are normally spaced and aligned. There is surrounding soft tissue edema. IMPRESSION: Nondisplaced transverse fracture across the distal radial metaphysis as described. Electronically Signed   By: Amie Portland M.D.   On: 06/16/2016 17:15    Assessment & Plan:   Stephanie Calderon was seen today for annual exam and hypertension.  Diagnoses and all orders for this visit:  Essential hypertension- her blood pressure is well-controlled, electrolytes and renal function are normal, will continue hydrochlorothiazide. -     Comprehensive metabolic panel; Future -     CBC with Differential/Platelet; Future -     TSH; Future -     Urinalysis, Routine w reflex microscopic; Future  Routine general medical examination at a health care facility- exam completed, she was not willing to do a PAP smear today, for colon cancer screening she was referred for colonoscopy, vaccines reviewed, her mammogram is up-to-date, patient education material was given. -     Lipid panel; Future  Screening for colon cancer -     Ambulatory referral to Gastroenterology   I have discontinued Stephanie Calderon's polyethylene glycol powder and bisacodyl. I am also having her maintain her aspirin EC and hydrochlorothiazide.  No orders of the defined types were placed in this encounter.    Follow-up: Return in about 6 months (around 03/13/2017).  Sanda Linger, MD

## 2016-09-10 NOTE — Patient Instructions (Signed)
Health Maintenance, Female Adopting a healthy lifestyle and getting preventive care can go a long way to promote health and wellness. Talk with your health care provider about what schedule of regular examinations is right for you. This is a good chance for you to check in with your provider about disease prevention and staying healthy. In between checkups, there are plenty of things you can do on your own. Experts have done a lot of research about which lifestyle changes and preventive measures are most likely to keep you healthy. Ask your health care provider for more information. Weight and diet Eat a healthy diet  Be sure to include plenty of vegetables, fruits, low-fat dairy products, and lean protein.  Do not eat a lot of foods high in solid fats, added sugars, or salt.  Get regular exercise. This is one of the most important things you can do for your health.  Most adults should exercise for at least 150 minutes each week. The exercise should increase your heart rate and make you sweat (moderate-intensity exercise).  Most adults should also do strengthening exercises at least twice a week. This is in addition to the moderate-intensity exercise. Maintain a healthy weight  Body mass index (BMI) is a measurement that can be used to identify possible weight problems. It estimates body fat based on height and weight. Your health care provider can help determine your BMI and help you achieve or maintain a healthy weight.  For females 76 years of age and older:  A BMI below 18.5 is considered underweight.  A BMI of 18.5 to 24.9 is normal.  A BMI of 25 to 29.9 is considered overweight.  A BMI of 30 and above is considered obese. Watch levels of cholesterol and blood lipids  You should start having your blood tested for lipids and cholesterol at 65 years of age, then have this test every 5 years.  You may need to have your cholesterol levels checked more often if:  Your lipid or  cholesterol levels are high.  You are older than 65 years of age.  You are at high risk for heart disease. Cancer screening Lung Cancer  Lung cancer screening is recommended for adults 64-42 years old who are at high risk for lung cancer because of a history of smoking.  A yearly low-dose CT scan of the lungs is recommended for people who:  Currently smoke.  Have quit within the past 15 years.  Have at least a 30-pack-year history of smoking. A pack year is smoking an average of one pack of cigarettes a day for 1 year.  Yearly screening should continue until it has been 15 years since you quit.  Yearly screening should stop if you develop a health problem that would prevent you from having lung cancer treatment. Breast Cancer  Practice breast self-awareness. This means understanding how your breasts normally appear and feel.  It also means doing regular breast self-exams. Let your health care provider know about any changes, no matter how small.  If you are in your 20s or 30s, you should have a clinical breast exam (CBE) by a health care provider every 1-3 years as part of a regular health exam.  If you are 34 or older, have a CBE every year. Also consider having a breast X-ray (mammogram) every year.  If you have a family history of breast cancer, talk to your health care provider about genetic screening.  If you are at high risk for breast cancer, talk  to your health care provider about having an MRI and a mammogram every year.  Breast cancer gene (BRCA) assessment is recommended for women who have family members with BRCA-related cancers. BRCA-related cancers include:  Breast.  Ovarian.  Tubal.  Peritoneal cancers.  Results of the assessment will determine the need for genetic counseling and BRCA1 and BRCA2 testing. Cervical Cancer  Your health care provider may recommend that you be screened regularly for cancer of the pelvic organs (ovaries, uterus, and vagina).  This screening involves a pelvic examination, including checking for microscopic changes to the surface of your cervix (Pap test). You may be encouraged to have this screening done every 3 years, beginning at age 33.  For women ages 23-65, health care providers may recommend pelvic exams and Pap testing every 3 years, or they may recommend the Pap and pelvic exam, combined with testing for human papilloma virus (HPV), every 5 years. Some types of HPV increase your risk of cervical cancer. Testing for HPV may also be done on women of any age with unclear Pap test results.  Other health care providers may not recommend any screening for nonpregnant women who are considered low risk for pelvic cancer and who do not have symptoms. Ask your health care provider if a screening pelvic exam is right for you.  If you have had past treatment for cervical cancer or a condition that could lead to cancer, you need Pap tests and screening for cancer for at least 20 years after your treatment. If Pap tests have been discontinued, your risk factors (such as having a new sexual partner) need to be reassessed to determine if screening should resume. Some women have medical problems that increase the chance of getting cervical cancer. In these cases, your health care provider may recommend more frequent screening and Pap tests. Colorectal Cancer  This type of cancer can be detected and often prevented.  Routine colorectal cancer screening usually begins at 65 years of age and continues through 65 years of age.  Your health care provider may recommend screening at an earlier age if you have risk factors for colon cancer.  Your health care provider may also recommend using home test kits to check for hidden blood in the stool.  A small camera at the end of a tube can be used to examine your colon directly (sigmoidoscopy or colonoscopy). This is done to check for the earliest forms of colorectal cancer.  Routine  screening usually begins at age 78.  Direct examination of the colon should be repeated every 5-10 years through 65 years of age. However, you may need to be screened more often if early forms of precancerous polyps or small growths are found. Skin Cancer  Check your skin from head to toe regularly.  Tell your health care provider about any new moles or changes in moles, especially if there is a change in a mole's shape or color.  Also tell your health care provider if you have a mole that is larger than the size of a pencil eraser.  Always use sunscreen. Apply sunscreen liberally and repeatedly throughout the day.  Protect yourself by wearing long sleeves, pants, a wide-brimmed hat, and sunglasses whenever you are outside. Heart disease, diabetes, and high blood pressure  High blood pressure causes heart disease and increases the risk of stroke. High blood pressure is more likely to develop in:  People who have blood pressure in the high end of the normal range (130-139/85-89 mm Hg).  People who are overweight or obese.  People who are African American.  If you are 74-60 years of age, have your blood pressure checked every 3-5 years. If you are 66 years of age or older, have your blood pressure checked every year. You should have your blood pressure measured twice-once when you are at a hospital or clinic, and once when you are not at a hospital or clinic. Record the average of the two measurements. To check your blood pressure when you are not at a hospital or clinic, you can use:  An automated blood pressure machine at a pharmacy.  A home blood pressure monitor.  If you are between 39 years and 40 years old, ask your health care provider if you should take aspirin to prevent strokes.  Have regular diabetes screenings. This involves taking a blood sample to check your fasting blood sugar level.  If you are at a normal weight and have a low risk for diabetes, have this test once  every three years after 65 years of age.  If you are overweight and have a high risk for diabetes, consider being tested at a younger age or more often. Preventing infection Hepatitis B  If you have a higher risk for hepatitis B, you should be screened for this virus. You are considered at high risk for hepatitis B if:  You were born in a country where hepatitis B is common. Ask your health care provider which countries are considered high risk.  Your parents were born in a high-risk country, and you have not been immunized against hepatitis B (hepatitis B vaccine).  You have HIV or AIDS.  You use needles to inject street drugs.  You live with someone who has hepatitis B.  You have had sex with someone who has hepatitis B.  You get hemodialysis treatment.  You take certain medicines for conditions, including cancer, organ transplantation, and autoimmune conditions. Hepatitis C  Blood testing is recommended for:  Everyone born from [birth year] through 1964.  Anyone with known risk factors for hepatitis C. Sexually transmitted infections (STIs)  You should be screened for sexually transmitted infections (STIs) including gonorrhea and chlamydia if:  You are sexually active and are younger than 65 years of age.  You are older than 65 years of age and your health care provider tells you that you are at risk for this type of infection.  Your sexual activity has changed since you were last screened and you are at an increased risk for chlamydia or gonorrhea. Ask your health care provider if you are at risk.  If you do not have HIV, but are at risk, it may be recommended that you take a prescription medicine daily to prevent HIV infection. This is called pre-exposure prophylaxis (PrEP). You are considered at risk if:  You are sexually active and do not regularly use condoms or know the HIV status of your partner(s).  You take drugs by injection.  You are sexually active with a partner  who has HIV. Talk with your health care provider about whether you are at high risk of being infected with HIV. If you choose to begin PrEP, you should first be tested for HIV. You should then be tested every 3 months for as long as you are taking PrEP. Pregnancy  If you are premenopausal and you may become pregnant, ask your health care provider about preconception counseling.  If you may become pregnant, take 400 to 800 micrograms (mcg) of folic acid  every day.  If you want to prevent pregnancy, talk to your health care provider about birth control (contraception). Osteoporosis and menopause  Osteoporosis is a disease in which the bones lose minerals and strength with aging. This can result in serious bone fractures. Your risk for osteoporosis can be identified using a bone density scan.  If you are 4 years of age or older, or if you are at risk for osteoporosis and fractures, ask your health care provider if you should be screened.  Ask your health care provider whether you should take a calcium or vitamin D supplement to lower your risk for osteoporosis.  Menopause may have certain physical symptoms and risks.  Hormone replacement therapy may reduce some of these symptoms and risks. Talk to your health care provider about whether hormone replacement therapy is right for you. Follow these instructions at home:  Schedule regular health, dental, and eye exams.  Stay current with your immunizations.  Do not use any tobacco products including cigarettes, chewing tobacco, or electronic cigarettes.  If you are pregnant, do not drink alcohol.  If you are breastfeeding, limit how much and how often you drink alcohol.  Limit alcohol intake to no more than 1 drink per day for nonpregnant women. One drink equals 12 ounces of beer, 5 ounces of wine, or 1 ounces of hard liquor.  Do not use street drugs.  Do not share needles.  Ask your health care provider for help if you need support  or information about quitting drugs.  Tell your health care provider if you often feel depressed.  Tell your health care provider if you have ever been abused or do not feel safe at home. This information is not intended to replace advice given to you by your health care provider. Make sure you discuss any questions you have with your health care provider. Document Released: 11/11/2010 Document Revised: 10/04/2015 Document Reviewed: 01/30/2015 Elsevier Interactive Patient Education  2017 [employer].

## 2016-09-25 ENCOUNTER — Encounter: Payer: Self-pay | Admitting: Internal Medicine

## 2016-09-29 ENCOUNTER — Other Ambulatory Visit: Payer: Self-pay | Admitting: *Deleted

## 2016-09-29 DIAGNOSIS — I10 Essential (primary) hypertension: Secondary | ICD-10-CM

## 2016-09-29 MED ORDER — HYDROCHLOROTHIAZIDE 25 MG PO TABS: 25.0000 mg | ORAL_TABLET | Freq: Every day | ORAL | 1 refills | Status: DC

## 2016-09-29 NOTE — Telephone Encounter (Signed)
Rec'd call pt states she saw MD on 5/2, and her refills was not sent in for her HCTZ. Verified chart & pharmacy inform pt will send electronically to [facility].../lmb

## 2016-09-30 ENCOUNTER — Telehealth: Payer: Self-pay

## 2016-09-30 NOTE — Telephone Encounter (Signed)
[facility] sent a fax that stated pt has not taken the HCTZ since April.

## 2016-10-01 NOTE — Telephone Encounter (Signed)
Pt stated that she picked up her medication and will start taking it. She was advise to contact us if there is any issue with BP, HA, CP, SOB or DOE

## 2016-10-28 ENCOUNTER — Encounter: Payer: Self-pay | Admitting: Internal Medicine

## 2017-03-21 ENCOUNTER — Other Ambulatory Visit: Payer: Self-pay | Admitting: Internal Medicine

## 2017-03-21 DIAGNOSIS — I10 Essential (primary) hypertension: Secondary | ICD-10-CM

## 2017-09-14 ENCOUNTER — Other Ambulatory Visit (INDEPENDENT_AMBULATORY_CARE_PROVIDER_SITE_OTHER): Payer: Medicare Other

## 2017-09-14 ENCOUNTER — Ambulatory Visit (INDEPENDENT_AMBULATORY_CARE_PROVIDER_SITE_OTHER): Payer: Medicare Other | Admitting: Internal Medicine

## 2017-09-14 ENCOUNTER — Encounter: Payer: Self-pay | Admitting: Internal Medicine

## 2017-09-14 VITALS — BP 130/80 | HR 81 | Temp 98.2°F | Resp 16 | Ht 67.0 in | Wt 150.5 lb

## 2017-09-14 DIAGNOSIS — Z0000 Encounter for general adult medical examination without abnormal findings: Secondary | ICD-10-CM | POA: Diagnosis not present

## 2017-09-14 DIAGNOSIS — Z23 Encounter for immunization: Secondary | ICD-10-CM | POA: Diagnosis not present

## 2017-09-14 DIAGNOSIS — Z1231 Encounter for screening mammogram for malignant neoplasm of breast: Secondary | ICD-10-CM

## 2017-09-14 DIAGNOSIS — I10 Essential (primary) hypertension: Secondary | ICD-10-CM

## 2017-09-14 DIAGNOSIS — Z1211 Encounter for screening for malignant neoplasm of colon: Secondary | ICD-10-CM

## 2017-09-14 DIAGNOSIS — E785 Hyperlipidemia, unspecified: Secondary | ICD-10-CM

## 2017-09-14 DIAGNOSIS — Z1212 Encounter for screening for malignant neoplasm of rectum: Secondary | ICD-10-CM

## 2017-09-14 LAB — URINALYSIS, ROUTINE W REFLEX MICROSCOPIC
Bilirubin Urine: NEGATIVE
Hgb urine dipstick: NEGATIVE
Ketones, ur: NEGATIVE
Nitrite: NEGATIVE
Specific Gravity, Urine: 1.015 (ref 1.000–1.030)
TOTAL PROTEIN, URINE-UPE24: NEGATIVE
Urine Glucose: NEGATIVE
Urobilinogen, UA: 0.2 (ref 0.0–1.0)
pH: 6 (ref 5.0–8.0)

## 2017-09-14 LAB — LIPID PANEL
Cholesterol: 191 mg/dL (ref 0–200)
HDL: 71.1 mg/dL (ref >39.00)
LDL CALC: 92 mg/dL (ref 0–99)
NONHDL: 119.55
Total CHOL/HDL Ratio: 3
Triglycerides: 138 mg/dL (ref 0.0–149.0)
VLDL: 27.6 mg/dL (ref 0.0–40.0)

## 2017-09-14 LAB — CBC WITH DIFFERENTIAL/PLATELET
Basophils Absolute: 0 10*3/uL (ref 0.0–0.1)
Basophils Relative: 0.7 % (ref 0.0–3.0)
EOS PCT: 5 % (ref 0.0–5.0)
Eosinophils Absolute: 0.3 10*3/uL (ref 0.0–0.7)
HCT: 37.8 % (ref 36.0–46.0)
HEMOGLOBIN: 12.3 g/dL (ref 12.0–15.0)
Lymphocytes Relative: 33.5 % (ref 12.0–46.0)
Lymphs Abs: 2.2 10*3/uL (ref 0.7–4.0)
MCHC: 32.7 g/dL (ref 30.0–36.0)
MCV: 76.5 fl — ABNORMAL LOW (ref 78.0–100.0)
MONO ABS: 0.5 10*3/uL (ref 0.1–1.0)
Monocytes Relative: 8.5 % (ref 3.0–12.0)
NEUTROS ABS: 3.4 10*3/uL (ref 1.4–7.7)
Neutrophils Relative %: 52.3 % (ref 43.0–77.0)
PLATELETS: 274 10*3/uL (ref 150.0–400.0)
RBC: 4.94 Mil/uL (ref 3.87–5.11)
RDW: 16.9 % — AB (ref 11.5–15.5)
WBC: 6.4 10*3/uL (ref 4.0–10.5)

## 2017-09-14 LAB — COMPREHENSIVE METABOLIC PANEL
ALT: 19 U/L (ref 0–35)
AST: 18 U/L (ref 0–37)
Albumin: 4.2 g/dL (ref 3.5–5.2)
Alkaline Phosphatase: 52 U/L (ref 39–117)
BUN: 17 mg/dL (ref 6–23)
CHLORIDE: 105 meq/L (ref 96–112)
CO2: 27 mEq/L (ref 19–32)
Calcium: 9.7 mg/dL (ref 8.4–10.5)
Creatinine, Ser: 0.94 mg/dL (ref 0.40–1.20)
GFR: 76.7 mL/min (ref >60.00)
GLUCOSE: 107 mg/dL — AB (ref 70–99)
POTASSIUM: 4 meq/L (ref 3.5–5.1)
SODIUM: 141 meq/L (ref 135–145)
Total Bilirubin: 0.6 mg/dL (ref 0.2–1.2)
Total Protein: 7.5 g/dL (ref 6.0–8.3)

## 2017-09-14 NOTE — Assessment & Plan Note (Addendum)
We discussed advanced directives and advance planning regarding healthcare decisions.  She elected not to make any decisions at this time.   The written screening recommendations is given to patient and attached in the patent instructions or AVS.   The patient is here for annual Medicare wellness examination and management of other chronic and acute problems.   The risk factors are reflected in the social history.  The roster of all physicians providing medical care to patient - is listed in the Snapshot section of the chart.  Activities of daily living:  The patient is 100% inedpendent in all ADLs: dressing, toileting, feeding as well as independent mobility  Home safety : The patient has smoke detectors in the home. They wear seatbelts.No firearms at home ( firearms are present in the home, kept in a safe fashion). There is no violence in the home.   There is no risks for hepatitis, STDs or HIV. There is no   history of blood transfusion. They have no travel history to infectious disease endemic areas of the world.  The patient has (has not) seen their dentist in the last six month. They have (not) seen their eye doctor in the last year. They deny (admit to) any hearing difficulty and have not had audiologic testing in the last year.  They do not  have excessive sun exposure. Discussed the need for sun protection: hats, long sleeves and use of sunscreen if there is significant sun exposure.   Diet: the importance of a healthy diet is discussed. They do have a healthy (unhealthy-high fat/fast food) diet.  The patient has a regular exercise program.  The benefits of regular aerobic exercise were discussed.  Depression screen: there are no signs or vegative symptoms of depression- irritability, change in appetite, anhedonia, sadness/tearfullness.  Cognitive assessment: the patient manages all their financial and personal affairs and is actively engaged. They could relate day,date,year and  events; recalled 3/3 objects at 3 minutes; performed clock-face test normally.  The following portions of the patient's history were reviewed and updated as appropriate: allergies, current medications, past family history, past medical history,  past surgical history, past social history  and problem list.  Vision, hearing, body mass index were assessed and reviewed.   During the course of the visit the patient was educated and counseled about appropriate screening and preventive services including : fall prevention , diabetes screening, nutrition counseling, colorectal cancer screening, and recommended immunizations.

## 2017-09-14 NOTE — Progress Notes (Signed)
Subjective:  Patient ID: Stephanie Calderon, female    DOB: 1951/02/10  Age: 67 y.o. MRN: [id number]  CC: Annual Exam and Hypertension   HPI Stephanie Calderon presents for a CPX.  She tells me her blood pressure has been well controlled on the thiazide diuretic.  She has been working on her lifestyle modifications and denies any recent episodes of headache/blurred vision/chest pain/shortness of breath/palpitations/edema/fatigue.  Past Medical History:  Diagnosis Date  . DJD (degenerative joint disease) of knee 11/29/2012  . DOE (dyspnea on exertion) 07/24/2015  . Essential hypertension 07/24/2015  . Routine general medical examination at a health care facility 11/29/2012  . Screen for colon cancer 07/24/2015   Past Surgical History:  Procedure Laterality Date  . TUBAL LIGATION    . WISDOM TOOTH EXTRACTION      reports that she has never smoked. She has never used smokeless tobacco. She reports that she drinks about 1.2 oz of alcohol per week. She reports that she does not use drugs. family history includes Heart disease in her brother and sister. No Known Allergies  Outpatient Medications Prior to Visit  Medication Sig Dispense Refill  . aspirin EC 81 MG tablet Take 81 mg by mouth daily.    . hydrochlorothiazide (HYDRODIURIL) 25 MG tablet TAKE 1 TABLET BY MOUTH EVERY DAY 90 tablet 1  . Multiple Vitamin (MULTIVITAMIN) tablet Take 1 tablet by mouth daily.    . vitamin B-12 (CYANOCOBALAMIN) 1000 MCG tablet Take 1,000 mcg by mouth daily.     No facility-administered medications prior to visit.     ROS Review of Systems  Constitutional: Negative.  Negative for appetite change, diaphoresis, fatigue and unexpected weight change.  HENT: Negative.   Eyes: Negative for visual disturbance.  Respiratory: Negative for apnea, cough, chest tightness, shortness of breath and wheezing.   Cardiovascular: Negative for chest pain, palpitations and leg swelling.  Gastrointestinal: Negative for abdominal  pain, constipation, diarrhea, nausea and vomiting.  Endocrine: Negative.   Genitourinary: Negative.  Negative for difficulty urinating, dysuria, frequency and hematuria.  Musculoskeletal: Negative.  Negative for arthralgias and myalgias.  Skin: Negative.   Allergic/Immunologic: Negative.   Neurological: Negative.  Negative for dizziness, weakness and headaches.  Hematological: Negative for adenopathy. Does not bruise/bleed easily.  Psychiatric/Behavioral: Negative.     Objective:  BP 130/80 (BP Location: Left Arm, Patient Position: Sitting, Cuff Size: Normal)   Pulse 81   Temp 98.2 F (36.8 C) (Oral)   Resp 16   Ht  (1.702 m)   Wt 150 lb 8 oz (68.3 kg)   SpO2 99%   BMI 23.57 kg/m   BP Readings from Last 3 Encounters:  09/14/17 130/80  09/10/16 124/84  06/16/16 (!) 160/90    Wt Readings from Last 3 Encounters:  09/14/17 150 lb 8 oz (68.3 kg)  09/10/16 157 lb 8 oz (71.4 kg)  06/16/16 161 lb 4 oz (73.1 kg)    Physical Exam  Constitutional: She is oriented to person, place, and time. No distress.  HENT:  Mouth/Throat: Oropharynx is clear and moist. No oropharyngeal exudate.  Eyes: Conjunctivae are normal. Left eye exhibits no discharge. No scleral icterus.  Neck: Normal range of motion. Neck supple. No JVD present. No thyromegaly present.  Cardiovascular: Normal rate, regular rhythm and normal heart sounds. Exam reveals no gallop and no friction rub.  No murmur heard. Pulmonary/Chest: Effort normal and breath sounds normal. No stridor. No respiratory distress. She has no wheezes. She has no rhonchi. She has  no rales. Right breast exhibits no inverted nipple, no mass, no nipple discharge, no skin change and no tenderness. Left breast exhibits no inverted nipple, no mass, no nipple discharge, no skin change and no tenderness.  Abdominal: Soft. Bowel sounds are normal. She exhibits no distension and no mass. There is no tenderness.  Musculoskeletal: Normal range of motion.  She exhibits no edema or deformity.  Lymphadenopathy:    She has no cervical adenopathy.  Neurological: She is alert and oriented to person, place, and time.  Skin: Skin is warm and dry. She is not diaphoretic. No pallor.  Vitals reviewed.   Lab Results  Component Value Date   WBC 6.4 09/14/2017   HGB 12.3 09/14/2017   HCT 37.8 09/14/2017   PLT 274.0 09/14/2017   GLUCOSE 107 (H) 09/14/2017   CHOL 191 09/14/2017   TRIG 138.0 09/14/2017   HDL 71.10 09/14/2017   LDLDIRECT 81.0 07/24/2015   LDLCALC 92 09/14/2017   ALT 19 09/14/2017   AST 18 09/14/2017   NA 141 09/14/2017   K 4.0 09/14/2017   CL 105 09/14/2017   CREATININE 0.94 09/14/2017   BUN 17 09/14/2017   CO2 27 09/14/2017   TSH 3.24 09/10/2016    Dg Wrist Complete Left  Result Date: 06/16/2016 CLINICAL DATA:  Fall 2 weeks ago.  Wrist pain. EXAM: LEFT WRIST - COMPLETE 3+ VIEW COMPARISON:  None. FINDINGS: There is a transverse fracture, mildly comminuted, across the distal radial metaphysis. No significant fracture displacement. There is slight impaction with no significant dorsal angulation of the articular surface. There is mild loss of normal radial inclination. No other fractures.  The joints are normally spaced and aligned. There is surrounding soft tissue edema. IMPRESSION: Nondisplaced transverse fracture across the distal radial metaphysis as described. Electronically Signed   By: Amie Portland M.D.   On: 06/16/2016 17:15    Visual Acuity Screening   Right eye Left eye Both eyes  Without correction:  With correction:     Hearing Screening Comments: Patient passed whisper test.     Assessment & Plan:   Stephanie Calderon was seen today for annual exam and hypertension.  Diagnoses and all orders for this visit:  Essential hypertension- Her BP is well controlled.  Electrolytes and renal function are normal. -     CBC with Differential/Platelet; Future -     Comprehensive metabolic panel; Future -      Urinalysis, Routine w reflex microscopic; Future  Hyperlipidemia LDL goal <130- She has a low ASCVD risk score so I do not recommend a statin for CV risk reduction. -     Comprehensive metabolic panel; Future -     Lipid panel; Future  Visit for screening mammogram -     MM DIGITAL SCREENING BILATERAL; Future  Screen for colon cancer -     Cologuard  Need for pneumococcal vaccination -     Pneumococcal conjugate vaccine 13-valent  Screening for malignant neoplasm of the rectum -     Cologuard   I am having Stephanie Calderon maintain her aspirin EC, hydrochlorothiazide, vitamin B-12, and multivitamin.  No orders of the defined types were placed in this encounter.  See AVS for instructions about healthy living and anticipatory guidance.  Follow-up: Return in about 6 months (around 03/17/2018).  Sanda Linger, MD

## 2017-09-14 NOTE — Patient Instructions (Signed)

## 2017-09-15 ENCOUNTER — Encounter: Payer: Self-pay | Admitting: Internal Medicine

## 2017-09-16 ENCOUNTER — Other Ambulatory Visit: Payer: Self-pay | Admitting: Internal Medicine

## 2017-09-16 DIAGNOSIS — I10 Essential (primary) hypertension: Secondary | ICD-10-CM

## 2017-11-20 ENCOUNTER — Ambulatory Visit
Admission: RE | Admit: 2017-11-20 | Discharge: 2017-11-20 | Disposition: A | Discharge status: Home / Self Care | Payer: Medicare Other | Source: Ambulatory Visit | Attending: Internal Medicine | Admitting: Internal Medicine

## 2017-11-20 ENCOUNTER — Ambulatory Visit: Payer: Medicare Other

## 2017-11-20 DIAGNOSIS — Z1231 Encounter for screening mammogram for malignant neoplasm of breast: Secondary | ICD-10-CM

## 2017-11-20 LAB — HM MAMMOGRAPHY

## 2018-03-13 ENCOUNTER — Other Ambulatory Visit: Payer: Self-pay | Admitting: Internal Medicine

## 2018-03-13 DIAGNOSIS — I10 Essential (primary) hypertension: Secondary | ICD-10-CM

## 2018-06-11 ENCOUNTER — Other Ambulatory Visit: Payer: Self-pay | Admitting: Internal Medicine

## 2018-06-11 DIAGNOSIS — I10 Essential (primary) hypertension: Secondary | ICD-10-CM

## 2018-06-11 MED ORDER — HYDROCHLOROTHIAZIDE 25 MG PO TABS: 25.0000 mg | ORAL_TABLET | Freq: Every day | ORAL | 0 refills | Status: DC

## 2018-06-11 NOTE — Telephone Encounter (Signed)
Patient called regarding her refills for medication.  Agent informed patient that she needed an appointment scheduled.  Patient did schedule her appointment for February 24th with Dr. Yetta Barre.  Please call patient in some medication to last her up until her appointment.  Call patient with any questions at [phone]

## 2018-06-11 NOTE — Telephone Encounter (Signed)
Left detailed message informing pt that she would need an appt for any refills. I can send in a short supply to get her to the appt date.   Let me know if she schedules.

## 2018-06-11 NOTE — Telephone Encounter (Signed)
Pt is over due for follow up 

## 2018-06-11 NOTE — Addendum Note (Signed)
Addended by: Zenovia JordanMITCHELL, Ebonye Reade B on: 06/11/2018 11:33 AM   Modules accepted: Orders

## 2018-07-05 ENCOUNTER — Ambulatory Visit (INDEPENDENT_AMBULATORY_CARE_PROVIDER_SITE_OTHER): Payer: Medicare Other | Admitting: Internal Medicine

## 2018-07-05 ENCOUNTER — Encounter: Payer: Self-pay | Admitting: Internal Medicine

## 2018-07-05 ENCOUNTER — Other Ambulatory Visit (INDEPENDENT_AMBULATORY_CARE_PROVIDER_SITE_OTHER): Payer: Medicare Other

## 2018-07-05 VITALS — BP 134/86 | HR 70 | Temp 98.3°F | Resp 16 | Ht 67.0 in | Wt 149.2 lb

## 2018-07-05 DIAGNOSIS — I10 Essential (primary) hypertension: Secondary | ICD-10-CM

## 2018-07-05 DIAGNOSIS — Z1211 Encounter for screening for malignant neoplasm of colon: Secondary | ICD-10-CM

## 2018-07-05 DIAGNOSIS — E2839 Other primary ovarian failure: Secondary | ICD-10-CM

## 2018-07-05 LAB — BASIC METABOLIC PANEL
BUN: 16 mg/dL (ref 6–23)
CALCIUM: 10.1 mg/dL (ref 8.4–10.5)
CO2: 29 mEq/L (ref 19–32)
CREATININE: 0.82 mg/dL (ref 0.40–1.20)
Chloride: 101 mEq/L (ref 96–112)
GFR: 84.27 mL/min (ref >60.00)
Glucose, Bld: 93 mg/dL (ref 70–99)
Potassium: 4.4 mEq/L (ref 3.5–5.1)
Sodium: 139 mEq/L (ref 135–145)

## 2018-07-05 LAB — TSH: TSH: 5.04 u[IU]/mL — ABNORMAL HIGH (ref 0.35–4.50)

## 2018-07-05 MED ORDER — HYDROCHLOROTHIAZIDE 25 MG PO TABS: 25.0000 mg | ORAL_TABLET | Freq: Every day | ORAL | 1 refills | Status: DC

## 2018-07-05 NOTE — Progress Notes (Signed)
Subjective:  Patient ID: Stephanie Calderon, female    DOB: April 16, 1951  Age: 68 y.o. MRN: [id number]  CC: Hypertension   HPI [name] presents for f/up - She tells me her blood pressure has been well controlled.  She feels well today and offers no complaints.  Outpatient Medications Prior to Visit  Medication Sig Dispense Refill  . aspirin EC 81 MG tablet Take 81 mg by mouth daily.    . Multiple Vitamin (MULTIVITAMIN) tablet Take 1 tablet by mouth daily.    . vitamin B-12 (CYANOCOBALAMIN) 1000 MCG tablet Take 1,000 mcg by mouth daily.    . hydrochlorothiazide (HYDRODIURIL) 25 MG tablet Take 1 tablet (25 mg total) by mouth daily. 30 tablet 0   No facility-administered medications prior to visit.     ROS Review of Systems  Constitutional: Negative.  Negative for appetite change, diaphoresis, fatigue and unexpected weight change.  HENT: Negative.   Respiratory: Negative for cough, chest tightness, shortness of breath and wheezing.   Cardiovascular: Negative for chest pain, palpitations and leg swelling.  Gastrointestinal: Negative for abdominal pain, constipation, diarrhea, nausea and vomiting.  Endocrine: Negative.  Negative for cold intolerance and heat intolerance.  Genitourinary: Negative for difficulty urinating.  Musculoskeletal: Negative.  Negative for arthralgias, back pain, myalgias and neck pain.  Skin: Negative.  Negative for color change, pallor and rash.  Neurological: Negative for dizziness, weakness, light-headedness and numbness.  Hematological: Negative for adenopathy. Does not bruise/bleed easily.  Psychiatric/Behavioral: Negative.     Objective:  BP 134/86 (BP Location: Left Arm, Patient Position: Sitting, Cuff Size: Normal)   Pulse 70   Temp 98.3 F (36.8 C) (Oral)   Resp 16   Ht 5\' 7"  (1.702 m)   Wt 149 lb 4 oz (67.7 kg)   SpO2 99%   BMI 23.38 kg/m   BP Readings from Last 3 Encounters:  07/05/18 134/86  09/14/17 130/80  09/10/16 124/84    Wt  Readings from Last 3 Encounters:  07/05/18 149 lb 4 oz (67.7 kg)  09/14/17 150 lb 8 oz (68.3 kg)  09/10/16 157 lb 8 oz (71.4 kg)    Physical Exam Vitals signs reviewed.  Constitutional:      Appearance: She is not ill-appearing or diaphoretic.  HENT:     Nose: Nose normal. No congestion or rhinorrhea.     Mouth/Throat:     Mouth: Mucous membranes are moist.     Pharynx: Oropharynx is clear. No oropharyngeal exudate or posterior oropharyngeal erythema.  Eyes:     General: No scleral icterus.    Conjunctiva/sclera: Conjunctivae normal.  Neck:     Musculoskeletal: Normal range of motion and neck supple. No muscular tenderness.  Cardiovascular:     Rate and Rhythm: Normal rate and regular rhythm.     Pulses: Normal pulses.     Heart sounds: No murmur. No gallop.   Pulmonary:     Effort: Pulmonary effort is normal. No respiratory distress.     Breath sounds: No stridor. No rhonchi or rales.  Abdominal:     General: Abdomen is flat.     Palpations: There is no hepatomegaly or splenomegaly.     Tenderness: There is no abdominal tenderness.     Hernia: No hernia is present.  Musculoskeletal: Normal range of motion.        General: No swelling.     Right lower leg: No edema.     Left lower leg: No edema.  Skin:    General:  Skin is warm and dry.  Neurological:     General: No focal deficit present.     Mental Status: She is oriented to person, place, and time. Mental status is at baseline.     Lab Results  Component Value Date   WBC 6.4 09/14/2017   HGB 12.3 09/14/2017   HCT 37.8 09/14/2017   PLT 274.0 09/14/2017   GLUCOSE 93 07/05/2018   CHOL 191 09/14/2017   TRIG 138.0 09/14/2017   HDL 71.10 09/14/2017   LDLDIRECT 81.0 07/24/2015   LDLCALC 92 09/14/2017   ALT 19 09/14/2017   AST 18 09/14/2017   NA 139 07/05/2018   K 4.4 07/05/2018   CL 101 07/05/2018   CREATININE 0.82 07/05/2018   BUN 16 07/05/2018   CO2 29 07/05/2018   TSH 5.04 (H) 07/05/2018    Mm 3d  Screen Breast Bilateral  Result Date: 11/20/2017 CLINICAL DATA:  Screening. EXAM: DIGITAL SCREENING BILATERAL MAMMOGRAM WITH TOMO AND CAD COMPARISON:  Previous exam(s). ACR Breast Density Category b: There are scattered areas of fibroglandular density. FINDINGS: There are no findings suspicious for malignancy. Images were processed with CAD. IMPRESSION: No mammographic evidence of malignancy. A result letter of this screening mammogram will be mailed directly to the patient. RECOMMENDATION: Screening mammogram in one year. (Code:[id number]-B-01Y) BI-RADS CATEGORY  1: Negative. Electronically Signed   By: Hulan Saas M.D.   On: 11/20/2017 15:36    Assessment & Plan:   Raymonda was seen today for hypertension.  Diagnoses and all orders for this visit:  Essential hypertension- Her blood pressure is adequately well controlled.  Electrolytes and renal function are normal.  I have advised her to stay on the current dose of hydrochlorothiazide. -     Basic metabolic panel; Future -     TSH; Future -     hydrochlorothiazide (HYDRODIURIL) 25 MG tablet; Take 1 tablet (25 mg total) by mouth daily.  Estrogen deficiency -     DG Bone Density; Future  Colon cancer screening -     Cologuard   I am having Mattye Brucker maintain her aspirin EC, vitamin B-12, multivitamin, and hydrochlorothiazide.  Meds ordered this encounter  Medications  . hydrochlorothiazide (HYDRODIURIL) 25 MG tablet    Sig: Take 1 tablet (25 mg total) by mouth daily.    Dispense:  90 tablet    Refill:  1     Follow-up: Return in about 4 months (around 11/03/2018).  Sanda Linger, MD

## 2018-07-05 NOTE — Patient Instructions (Signed)

## 2018-07-12 ENCOUNTER — Inpatient Hospital Stay: Admission: RE | Admit: 2018-07-12 | Payer: Medicare Other | Source: Ambulatory Visit

## 2018-09-20 ENCOUNTER — Ambulatory Visit (INDEPENDENT_AMBULATORY_CARE_PROVIDER_SITE_OTHER): Payer: Medicare Other | Admitting: *Deleted

## 2018-09-20 DIAGNOSIS — Z0000 Encounter for general adult medical examination without abnormal findings: Secondary | ICD-10-CM

## 2018-09-20 NOTE — Patient Instructions (Addendum)
Continue doing brain stimulating activities (puzzles, reading, adult coloring books, staying active) to keep memory sharp.   Continue to eat heart healthy diet (full of fruits, vegetables, whole grains, lean protein, water--limit salt, fat, and sugar intake) and increase physical activity as tolerated.   Stephanie Calderon , Thank you for taking time to come for your Medicare Wellness Visit. I appreciate your ongoing commitment to your health goals. Please review the following plan we discussed and let me know if I can assist you in the future.   These are the goals we discussed: Goals    . Patient Stated     Continue to walk and do my exercise routine, eat healthy and stay socially active.         This is a list of the screening recommended for you and due dates:  Health Maintenance  Topic Date Due  . DEXA scan (bone density measurement)  01/09/2017  . Pneumonia vaccines (2 of 2 - PPSV23) 09/15/2018  . Colon Cancer Screening  07/06/2019*  . Flu Shot  12/11/2018  . Mammogram  11/21/2019  . Tetanus Vaccine  11/30/2022  .  Hepatitis C: One time screening is recommended by Center for Disease Control  (CDC) for  adults born from [birth year] through 1964.   Completed  *Topic was postponed. The date shown is not the original due date.     Bone Density Test The bone density test uses a special type of X-ray to measure the amount of calcium and other minerals in your bones. It can measure bone density in the hip and the spine. The test procedure is similar to having a regular X-ray. This test may also be called:  Bone densitometry.  Bone mineral density test.  Dual-energy X-ray absorptiometry (DEXA). You may have this test to:  Diagnose a condition that causes weak or thin bones (osteoporosis).  Screen you for osteoporosis.  Predict your risk for a broken bone (fracture).  Determine how well your osteoporosis treatment is working. Tell a health care provider about:  Any allergies you  have.  All medicines you are taking, including vitamins, herbs, eye drops, creams, and over-the-counter medicines.  Any problems you or family members have had with anesthetic medicines.  Any blood disorders you have.  Any surgeries you have had.  Any medical conditions you have.  Whether you are pregnant or may be pregnant.  Any medical tests you have had within the past 14 days that used contrast material. What are the risks? Generally, this is a safe procedure. However, it does expose you to a small amount of radiation, which can slightly increase your cancer risk. What happens before the procedure?  Do not take any calcium supplements starting 24 hours before your test.  Remove all metal jewelry, eyeglasses, dental appliances, and any other metal objects. What happens during the procedure?   You will lie down on an exam table. There will be an X-ray generator below you and an imaging device above you.  Other devices, such as boxes or braces, may be used to position your body properly for the scan.  The machine will slowly scan your body. You will need to keep still.  The images will show up on a screen in the room. Images will be examined by a specialist after your test is done. The procedure may vary among health care providers and hospitals. What happens after the procedure?  It is up to you to get your test results. Ask your health care  provider, or the department that is doing the test, when your results will be ready. Summary  A bone density test is an imaging test that uses a type of X-ray to measure the amount of calcium and other minerals in your bones.  The test may be used to diagnose or screen you for a condition that causes weak or thin bones (osteoporosis), predict your risk for a broken bone (fracture), or determine how well your osteoporosis treatment is working.  Do not take any calcium supplements starting 24 hours before your test.  Ask your health  care provider, or the department that is doing the test, when your results will be ready. This information is not intended to replace advice given to you by your health care provider. Make sure you discuss any questions you have with your health care provider. Document Released: 05/20/2004 Document Revised: 03/02/2017 Document Reviewed: 03/02/2017 Elsevier Interactive Patient Education  2019 [place] 65 Years and Older, Female Preventive care refers to lifestyle choices and visits with your health care provider that can promote health and wellness. What does preventive care include?  A yearly physical exam. This is also called an annual well check.  Dental exams once or twice a year.  Routine eye exams. Ask your health care provider how often you should have your eyes checked.  Personal lifestyle choices, including: ? Daily care of your teeth and gums. ? Regular physical activity. ? Eating a healthy diet. ? Avoiding tobacco and drug use. ? Limiting alcohol use. ? Practicing safe sex. ? Taking low-dose aspirin every day. ? Taking vitamin and mineral supplements as recommended by your health care provider. What happens during an annual well check? The services and screenings done by your health care provider during your annual well check will depend on your age, overall health, lifestyle risk factors, and family history of disease. Counseling Your health care provider may ask you questions about your:  Alcohol use.  Tobacco use.  Drug use.  Emotional well-being.  Home and relationship well-being.  Sexual activity.  Eating habits.  History of falls.  Memory and ability to understand (cognition).  Work and work Statistician.  Reproductive health.  Screening You may have the following tests or measurements:  Height, weight, and BMI.  Blood pressure.  Lipid and cholesterol levels. These may be checked every 5 years, or more frequently if you  are over 3 years old.  Skin check.  Lung cancer screening. You may have this screening every year starting at age 61 if you have a 30-pack-year history of smoking and currently smoke or have quit within the past 15 years.  Colorectal cancer screening. All adults should have this screening starting at age 42 and continuing until age 75. You will have tests every 1-10 years, depending on your results and the type of screening test. People at increased risk should start screening at an earlier age. Screening tests may include: ? Guaiac-based fecal occult blood testing. ? Fecal immunochemical test (FIT). ? Stool DNA test. ? Virtual colonoscopy. ? Sigmoidoscopy. During this test, a flexible tube with a tiny camera (sigmoidoscope) is used to examine your rectum and lower colon. The sigmoidoscope is inserted through your anus into your rectum and lower colon. ? Colonoscopy. During this test, a long, thin, flexible tube with a tiny camera (colonoscope) is used to examine your entire colon and rectum.  Hepatitis C blood test.  Hepatitis B blood test.  Sexually transmitted disease (STD) testing.  Diabetes  screening. This is done by checking your blood sugar (glucose) after you have not eaten for a while (fasting). You may have this done every 1-3 years.  Bone density scan. This is done to screen for osteoporosis. You may have this done starting at age 54.  Mammogram. This may be done every 1-2 years. Talk to your health care provider about how often you should have regular mammograms. Talk with your health care provider about your test results, treatment options, and if necessary, the need for more tests. Vaccines Your health care provider may recommend certain vaccines, such as:  Influenza vaccine. This is recommended every year.  Tetanus, diphtheria, and acellular pertussis (Tdap, Td) vaccine. You may need a Td booster every 10 years.  Varicella vaccine. You may need this if you have not  been vaccinated.  Zoster vaccine. You may need this after age 29.  Measles, mumps, and rubella (MMR) vaccine. You may need at least one dose of MMR if you were born in 1957 or later. You may also need a second dose.  Pneumococcal 13-valent conjugate (PCV13) vaccine. One dose is recommended after age 22.  Pneumococcal polysaccharide (PPSV23) vaccine. One dose is recommended after age 53.  Meningococcal vaccine. You may need this if you have certain conditions.  Hepatitis A vaccine. You may need this if you have certain conditions or if you travel or work in places where you may be exposed to hepatitis A.  Hepatitis B vaccine. You may need this if you have certain conditions or if you travel or work in places where you may be exposed to hepatitis B.  Haemophilus influenzae type b (Hib) vaccine. You may need this if you have certain conditions. Talk to your health care provider about which screenings and vaccines you need and how often you need them. This information is not intended to replace advice given to you by your health care provider. Make sure you discuss any questions you have with your health care provider. Document Released: 05/25/2015 Document Revised: 06/18/2017 Document Reviewed: 02/27/2015 Elsevier Interactive Patient Education  2019 [employer].

## 2018-09-20 NOTE — Progress Notes (Addendum)
Subjective:   Stephanie Calderon is a 68 y.o. female who presents for n Initial Medicare Annual Wellness Visit. I connected with patient by a telephone and verified that I am speaking with the correct person using two identifiers. Patient stated full name and DOB. Patient gave permission to continue with telephonic visit. Patient's location was at home and Nurse's location was at [place] office.   Review of Systems    No ROS.  Medicare Wellness Virtual Visit.  Visual/audio telehealth visit, UTA vital signs.   See social history for additional risk factors.  Cardiac Risk Factors include: advanced age (>72men, >31 women);dyslipidemia;hypertension Sleep patterns: feels rested on waking, gets up 1-2 times nightly to void and sleeps 7 hours nightly.    Home Safety/Smoke Alarms: Feels safe in home. Smoke alarms in place.  Living environment; residence and Firearm Safety: apartment. Lives alone, no needs for DME, good support system Seat Belt Safety/Bike Helmet: Wears seat belt.      Objective:    There were no vitals filed for this visit. There is no height or weight on file to calculate BMI.  Advanced Directives 09/20/2018  Does Patient Have a Medical Advance Directive? No  Would patient like information on creating a medical advance directive? Yes (ED - Information included in AVS)    Current Medications (verified) Outpatient Encounter Medications as of 09/20/2018  Medication Sig  . aspirin EC 81 MG tablet Take 81 mg by mouth daily.  . hydrochlorothiazide (HYDRODIURIL) 25 MG tablet Take 1 tablet (25 mg total) by mouth daily.  . Multiple Vitamin (MULTIVITAMIN) tablet Take 1 tablet by mouth daily.  . vitamin B-12 (CYANOCOBALAMIN) 1000 MCG tablet Take 1,000 mcg by mouth daily.   No facility-administered encounter medications on file as of 09/20/2018.     Allergies (verified) Patient has no known allergies.   History: Past Medical History:  Diagnosis Date  . DJD (degenerative joint  disease) of knee 11/29/2012  . DOE (dyspnea on exertion) 07/24/2015  . Essential hypertension 07/24/2015  . Routine general medical examination at a health care facility 11/29/2012  . Screen for colon cancer 07/24/2015   Past Surgical History:  Procedure Laterality Date  . TUBAL LIGATION    . WISDOM TOOTH EXTRACTION     Family History  Problem Relation Age of Onset  . Heart disease Sister   . Heart disease Brother   . Alcohol abuse Neg Hx   . Cancer Neg Hx   . COPD Neg Hx   . Diabetes Neg Hx   . Early death Neg Hx   . Drug abuse Neg Hx   . Hyperlipidemia Neg Hx   . Hypertension Neg Hx   . Kidney disease Neg Hx   . Stroke Neg Hx    Social History   Socioeconomic History  . Marital status: Single    Spouse name: Not on file  . Number of children: 2  . Years of education: Not on file  . Highest education level: Not on file  Occupational History  . Occupation: retired  Engineer, production  . Financial resource strain: Not hard at all  . Food insecurity:    Worry: Never true    Inability: Never true  . Transportation needs:    Medical: No    Non-medical: No  Tobacco Use  . Smoking status: Never Smoker  . Smokeless tobacco: Never Used  Substance and Sexual Activity  . Alcohol use: Yes    Alcohol/week: 2.0 standard drinks    Types:  2 Glasses of  per week  . Drug use: No  . Sexual activity: Not Currently  Lifestyle  . Physical activity:    Days per week: 3 days    Minutes per session: 40 min  . Stress: Not at all  Relationships  . Social connections:    Talks on phone: More than three times a week    Gets together: More than three times a week    Attends religious service: More than 4 times per year    Active member of club or organization: Yes    Attends meetings of clubs or organizations: More than 4 times per year    Relationship status: Not on file  Other Topics Concern  . Not on file  Social History Narrative  . Not on file    Tobacco Counseling  Counseling given: Not Answered  Activities of Daily Living In your present state of health, do you have any difficulty performing the following activities: 09/20/2018  Hearing? N  Vision? N  Difficulty concentrating or making decisions? N  Walking or climbing stairs? N  Dressing or bathing? N  Doing errands, shopping? N  Preparing Food and eating ? N  Using the Toilet? N  In the past six months, have you accidently leaked urine? N  Do you have problems with loss of bowel control? N  Managing your Medications? N  Managing your Finances? N  Housekeeping or managing your Housekeeping? N  Some recent data might be hidden     Immunizations and Health Maintenance Immunization History  Administered Date(s) Administered  . Pneumococcal Conjugate-13 09/14/2017  . Pneumococcal Polysaccharide-23 11/29/2012  . Tdap 11/29/2012   Health Maintenance Due  Topic Date Due  . DEXA SCAN  01/09/2017  . PNA vac Low Risk Adult (2 of 2 - PPSV23) 09/15/2018    Patient Care Team: Etta GrandchildJones, Thomas L, MD as PCP - General (Internal Medicine)  Indicate any recent Medical Services you may have received from other than [facility] providers in the past year (date may be approximate).     Assessment:   This is a routine wellness examination for Tinie. Physical assessment deferred to PCP.  Hearing/Vision screen Hearing Screening Comments: Able to hear conversational tones w/o difficulty. No issues reported.   Vision Screening Comments: Patient will make an appointment to go when the covid-19 crisis is over.     Dietary issues and exercise activities discussed: Current Exercise Habits: Home exercise routine, Type of exercise: walking, Time (Minutes): 40, Frequency (Times/Week): 3, Weekly Exercise (Minutes/Week): 120, Intensity: Mild, Exercise limited by: None identified  Diet (meal preparation, eat out, water intake, caffeinated beverages, dairy products, fruits and vegetables): in general, a "healthy" diet   , well balanced.  eats a variety of fruits and vegetables daily, limits salt, fat/cholesterol, sugar,carbohydrates,caffeine, drinks 6-8 glasses of water daily.  Goals   None    Depression Screen PHQ 2/9 Scores 09/14/2017 09/14/2017 09/10/2016 02/10/2013  PHQ - 2 Score 0 0 3 0  PHQ- 9 Score - - 3 -    Fall Risk Fall Risk  09/14/2017 09/14/2017 02/10/2013  Falls in the past year? No No No    Cognitive Function:       Ad8 score reviewed for issues:  Issues making decisions: no  Less interest in hobbies / activities: no  Repeats questions, stories (family complaining): no  Trouble using ordinary gadgets (microwave, computer, phone):no  Forgets the month or year: no  Mismanaging finances: no  Remembering appts: no  Daily problems with thinking and/or memory: no Ad8 score is= 0  Screening Tests Health Maintenance  Topic Date Due  . DEXA SCAN  01/09/2017  . PNA vac Low Risk Adult (2 of 2 - PPSV23) 09/15/2018  . COLONOSCOPY  07/06/2019 (Originally 01/09/2002)  . INFLUENZA VACCINE  12/11/2018  . MAMMOGRAM  11/21/2019  . TETANUS/TDAP  11/30/2022  . Hepatitis C Screening  Completed     Plan:    Reviewed health maintenance screenings with patient today and relevant education, vaccines, and/or referrals were provided.   Continue doing brain stimulating activities (puzzles, reading, adult coloring books, staying active) to keep memory sharp.   Continue to eat heart healthy diet (full of fruits, vegetables, whole grains, lean protein, water--limit salt, fat, and sugar intake) and increase physical activity as tolerated.  I have personally reviewed and noted the following in the patient's chart:   . Medical and social history . Use of alcohol, tobacco or illicit drugs  . Current medications and supplements . Functional ability and status . Nutritional status . Physical activity . Advanced directives . List of other physicians . Screenings to include cognitive, depression, and  falls . Referrals and appointments  In addition, I have reviewed and discussed with patient certain preventive protocols, quality metrics, and best practice recommendations. A written personalized care plan for preventive services as well as general preventive health recommendations were provided to patient.     Wanda Plump, RN   09/20/2018    Medical screening examination/treatment/procedure(s) were performed by non-physician practitioner and as supervising physician I was immediately available for consultation/collaboration. I agree with above. Sanda Linger, MD

## 2019-01-14 ENCOUNTER — Other Ambulatory Visit: Payer: Self-pay | Admitting: Internal Medicine

## 2019-01-14 DIAGNOSIS — I10 Essential (primary) hypertension: Secondary | ICD-10-CM

## 2019-04-12 ENCOUNTER — Other Ambulatory Visit: Payer: Self-pay

## 2019-04-12 DIAGNOSIS — Z20822 Contact with and (suspected) exposure to covid-19: Secondary | ICD-10-CM

## 2019-04-14 LAB — NOVEL CORONAVIRUS, NAA: SARS-CoV-2, NAA: NOT DETECTED

## 2019-04-23 ENCOUNTER — Other Ambulatory Visit: Payer: Self-pay | Admitting: Internal Medicine

## 2019-04-23 DIAGNOSIS — I10 Essential (primary) hypertension: Secondary | ICD-10-CM

## 2019-04-26 ENCOUNTER — Other Ambulatory Visit: Payer: Self-pay | Admitting: Internal Medicine

## 2019-04-26 DIAGNOSIS — I10 Essential (primary) hypertension: Secondary | ICD-10-CM

## 2019-04-27 ENCOUNTER — Other Ambulatory Visit: Payer: Self-pay | Admitting: Internal Medicine

## 2019-04-27 DIAGNOSIS — I10 Essential (primary) hypertension: Secondary | ICD-10-CM

## 2019-04-27 NOTE — Telephone Encounter (Signed)
Pt has an appt scheduled for 05/23/2019. This was the soonest she could schedule. Please advise if okay to refill HCTZ

## 2019-04-28 ENCOUNTER — Other Ambulatory Visit: Payer: Self-pay | Admitting: Internal Medicine

## 2019-04-28 DIAGNOSIS — I10 Essential (primary) hypertension: Secondary | ICD-10-CM

## 2019-04-28 MED ORDER — HYDROCHLOROTHIAZIDE 25 MG PO TABS: 25.0000 mg | ORAL_TABLET | Freq: Every day | ORAL | 0 refills | Status: DC

## 2019-05-21 ENCOUNTER — Other Ambulatory Visit: Payer: Self-pay | Admitting: Internal Medicine

## 2019-05-21 DIAGNOSIS — I10 Essential (primary) hypertension: Secondary | ICD-10-CM

## 2019-05-23 ENCOUNTER — Encounter: Payer: Self-pay | Admitting: Internal Medicine

## 2019-05-23 ENCOUNTER — Ambulatory Visit (INDEPENDENT_AMBULATORY_CARE_PROVIDER_SITE_OTHER): Payer: Medicare Other | Admitting: Internal Medicine

## 2019-05-23 ENCOUNTER — Other Ambulatory Visit: Payer: Self-pay

## 2019-05-23 ENCOUNTER — Encounter: Payer: Medicare Other | Admitting: Internal Medicine

## 2019-05-23 VITALS — BP 164/90 | HR 60 | Temp 98.1°F | Resp 16 | Ht 67.0 in | Wt 143.0 lb

## 2019-05-23 DIAGNOSIS — D539 Nutritional anemia, unspecified: Secondary | ICD-10-CM

## 2019-05-23 DIAGNOSIS — Z0001 Encounter for general adult medical examination with abnormal findings: Secondary | ICD-10-CM | POA: Diagnosis not present

## 2019-05-23 DIAGNOSIS — Z23 Encounter for immunization: Secondary | ICD-10-CM | POA: Diagnosis not present

## 2019-05-23 DIAGNOSIS — E785 Hyperlipidemia, unspecified: Secondary | ICD-10-CM | POA: Diagnosis not present

## 2019-05-23 DIAGNOSIS — E039 Hypothyroidism, unspecified: Secondary | ICD-10-CM | POA: Diagnosis not present

## 2019-05-23 DIAGNOSIS — I10 Essential (primary) hypertension: Secondary | ICD-10-CM

## 2019-05-23 DIAGNOSIS — I493 Ventricular premature depolarization: Secondary | ICD-10-CM

## 2019-05-23 DIAGNOSIS — Z0000 Encounter for general adult medical examination without abnormal findings: Secondary | ICD-10-CM

## 2019-05-23 DIAGNOSIS — Z1211 Encounter for screening for malignant neoplasm of colon: Secondary | ICD-10-CM

## 2019-05-23 DIAGNOSIS — Z1231 Encounter for screening mammogram for malignant neoplasm of breast: Secondary | ICD-10-CM

## 2019-05-23 DIAGNOSIS — E038 Other specified hypothyroidism: Secondary | ICD-10-CM

## 2019-05-23 LAB — CBC WITH DIFFERENTIAL/PLATELET
Basophils Absolute: 0.1 10*3/uL (ref 0.0–0.1)
Basophils Relative: 1.1 % (ref 0.0–3.0)
Eosinophils Absolute: 0.2 10*3/uL (ref 0.0–0.7)
Eosinophils Relative: 2.6 % (ref 0.0–5.0)
HCT: 36.1 % (ref 36.0–46.0)
Hemoglobin: 11.6 g/dL — ABNORMAL LOW (ref 12.0–15.0)
Lymphocytes Relative: 48 % — ABNORMAL HIGH (ref 12.0–46.0)
Lymphs Abs: 3.1 10*3/uL (ref 0.7–4.0)
MCHC: 32.2 g/dL (ref 30.0–36.0)
MCV: 75.9 fl — ABNORMAL LOW (ref 78.0–100.0)
Monocytes Absolute: 0.5 10*3/uL (ref 0.1–1.0)
Monocytes Relative: 7.3 % (ref 3.0–12.0)
Neutro Abs: 2.7 10*3/uL (ref 1.4–7.7)
Neutrophils Relative %: 41 % — ABNORMAL LOW (ref 43.0–77.0)
Platelets: 278 10*3/uL (ref 150.0–400.0)
RBC: 4.76 Mil/uL (ref 3.87–5.11)
RDW: 15.9 % — ABNORMAL HIGH (ref 11.5–15.5)
WBC: 6.5 10*3/uL (ref 4.0–10.5)

## 2019-05-23 LAB — BASIC METABOLIC PANEL
BUN: 14 mg/dL (ref 6–23)
CO2: 27 mEq/L (ref 19–32)
Calcium: 9.7 mg/dL (ref 8.4–10.5)
Chloride: 103 mEq/L (ref 96–112)
Creatinine, Ser: 0.79 mg/dL (ref 0.40–1.20)
GFR: 87.74 mL/min (ref >60.00)
Glucose, Bld: 100 mg/dL — ABNORMAL HIGH (ref 70–99)
Potassium: 3.5 mEq/L (ref 3.5–5.1)
Sodium: 139 mEq/L (ref 135–145)

## 2019-05-23 LAB — URINALYSIS, ROUTINE W REFLEX MICROSCOPIC
Bilirubin Urine: NEGATIVE
Hgb urine dipstick: NEGATIVE
Ketones, ur: NEGATIVE
Leukocytes,Ua: NEGATIVE
Nitrite: NEGATIVE
Specific Gravity, Urine: 1.01 (ref 1.000–1.030)
Total Protein, Urine: NEGATIVE
Urine Glucose: NEGATIVE
Urobilinogen, UA: 0.2 (ref 0.0–1.0)
pH: 6 (ref 5.0–8.0)

## 2019-05-23 LAB — LIPID PANEL
Cholesterol: 197 mg/dL (ref 0–200)
HDL: 86 mg/dL (ref >39.00)
LDL Cholesterol: 91 mg/dL (ref 0–99)
NonHDL: 110.57
Total CHOL/HDL Ratio: 2
Triglycerides: 96 mg/dL (ref 0.0–149.0)
VLDL: 19.2 mg/dL (ref 0.0–40.0)

## 2019-05-23 LAB — HEPATIC FUNCTION PANEL
ALT: 18 U/L (ref 0–35)
AST: 20 U/L (ref 0–37)
Albumin: 4.4 g/dL (ref 3.5–5.2)
Alkaline Phosphatase: 56 U/L (ref 39–117)
Bilirubin, Direct: 0.1 mg/dL (ref 0.0–0.3)
Total Bilirubin: 0.5 mg/dL (ref 0.2–1.2)
Total Protein: 7.4 g/dL (ref 6.0–8.3)

## 2019-05-23 MED ORDER — TRIAMTERENE-HCTZ 37.5-25 MG PO CAPS: 1.0000 | ORAL_CAPSULE | Freq: Every day | ORAL | 0 refills | Status: DC

## 2019-05-23 NOTE — Progress Notes (Signed)
Subjective:  Patient ID: Stephanie Calderon, female    DOB: 07-Jul-1950  Age: 69 y.o. MRN: [id number]  CC: Annual Exam, Anemia, Hypertension, and Hyperlipidemia  This visit occurred during the SARS-CoV-2 public health emergency.  Safety protocols were in place, including screening questions prior to the visit, additional usage of staff PPE, and extensive cleaning of exam room while observing appropriate contact time as indicated for disinfecting solutions.    HPI Kandice Schmelter presents for a CPX.  She does not monitor her blood pressure but she tells me she is compliant with hydrochlorothiazide.  She is very active and denies any recent episodes of headache, blurred vision, chest pain, shortness of breath, dyspnea on exertion, edema, or fatigue.  Outpatient Medications Prior to Visit  Medication Sig Dispense Refill  . aspirin EC 81 MG tablet Take 81 mg by mouth daily.    . Multiple Vitamin (MULTIVITAMIN) tablet Take 1 tablet by mouth daily.    . vitamin B-12 (CYANOCOBALAMIN) 1000 MCG tablet Take 1,000 mcg by mouth daily.    . hydrochlorothiazide (HYDRODIURIL) 25 MG tablet TAKE 1 TABLET BY MOUTH EVERY DAY 30 tablet 0   No facility-administered medications prior to visit.    ROS Review of Systems  Constitutional: Negative for diaphoresis, fatigue and unexpected weight change.  HENT: Negative.   Eyes: Negative for visual disturbance.  Respiratory: Negative for cough, chest tightness, shortness of breath and wheezing.   Cardiovascular: Negative for chest pain, palpitations and leg swelling.  Gastrointestinal: Negative for abdominal pain, blood in stool, constipation, diarrhea, nausea and vomiting.  Endocrine: Negative.   Genitourinary: Negative.  Negative for difficulty urinating.  Musculoskeletal: Negative for arthralgias and myalgias.  Skin: Negative.   Neurological: Negative.  Negative for dizziness, weakness, light-headedness and headaches.  Hematological: Negative for adenopathy.  Does not bruise/bleed easily.  Psychiatric/Behavioral: Negative.     Objective:  BP (!) 164/90 (BP Location: Left Arm, Patient Position: Sitting, Cuff Size: Normal)   Pulse 60   Temp 98.1 F (36.7 C) (Oral)   Resp 16   Ht 5\' 7"  (1.702 m)   Wt 143 lb (64.9 kg)   SpO2 99%   BMI 22.40 kg/m   BP Readings from Last 3 Encounters:  05/23/19 (!) 164/90  07/05/18 134/86  09/14/17 130/80    Wt Readings from Last 3 Encounters:  05/23/19 143 lb (64.9 kg)  07/05/18 149 lb 4 oz (67.7 kg)  09/14/17 150 lb 8 oz (68.3 kg)    Physical Exam Vitals reviewed.  Constitutional:      Appearance: Normal appearance.  HENT:     Nose: Nose normal.     Mouth/Throat:     Mouth: Mucous membranes are moist.  Eyes:     General: No scleral icterus.    Conjunctiva/sclera: Conjunctivae normal.  Cardiovascular:     Rate and Rhythm: Normal rate and regular rhythm. Occasional extrasystoles are present.    Pulses: Normal pulses.     Heart sounds: No murmur.     Comments: NSR with occasional PVC NS ST abnormalities No LVH Pulmonary:     Effort: Pulmonary effort is normal. No respiratory distress.     Breath sounds: No stridor. No wheezing, rhonchi or rales.  Abdominal:     General: Abdomen is flat. Bowel sounds are normal. There is no distension.     Palpations: Abdomen is soft. There is no hepatomegaly or splenomegaly.     Tenderness: There is no abdominal tenderness.  Musculoskeletal:  General: Normal range of motion.     Cervical back: Neck supple.     Right lower leg: No edema.     Left lower leg: No edema.  Lymphadenopathy:     Cervical: No cervical adenopathy.  Skin:    General: Skin is warm and dry.  Neurological:     General: No focal deficit present.     Mental Status: She is alert.  Psychiatric:        Mood and Affect: Mood normal.        Behavior: Behavior normal.        Thought Content: Thought content normal.        Judgment: Judgment normal.     Lab Results   Component Value Date   WBC 6.5 05/23/2019   HGB 11.6 (L) 05/23/2019   HCT 36.1 05/23/2019   PLT 278.0 05/23/2019   GLUCOSE 100 (H) 05/23/2019   CHOL 197 05/23/2019   TRIG 96.0 05/23/2019   HDL 86.00 05/23/2019   LDLDIRECT 81.0 07/24/2015   LDLCALC 91 05/23/2019   ALT 18 05/23/2019   AST 20 05/23/2019   NA 139 05/23/2019   K 3.5 05/23/2019   CL 103 05/23/2019   CREATININE 0.79 05/23/2019   BUN 14 05/23/2019   CO2 27 05/23/2019   TSH 5.26 (H) 05/23/2019    MM 3D SCREEN BREAST BILATERAL  Result Date: 11/20/2017 CLINICAL DATA:  Screening. EXAM: DIGITAL SCREENING BILATERAL MAMMOGRAM WITH TOMO AND CAD COMPARISON:  Previous exam(s). ACR Breast Density Category b: There are scattered areas of fibroglandular density. FINDINGS: There are no findings suspicious for malignancy. Images were processed with CAD. IMPRESSION: No mammographic evidence of malignancy. A result letter of this screening mammogram will be mailed directly to the patient. RECOMMENDATION: Screening mammogram in one year. (Code:SM-B-01Y) BI-RADS CATEGORY  1: Negative. Electronically Signed   By: Hulan Saas M.D.   On: 11/20/2017 15:36    Assessment & Plan:   Deyjah was seen today for annual exam, anemia, hypertension and hyperlipidemia.  Diagnoses and all orders for this visit:  Essential hypertension- Her blood pressure is not adequately well controlled and she has borderline hypokalemia with a potassium of 3.5.  I recommended that she add triamterene to the hydrochlorothiazide.  Her EKG is negative for LVH or ischemia.  Labs are negative for secondary causes or endorgan damage. -     CBC with Differential -     Basic metabolic panel -     TSH -     Urinalysis, Routine w reflex microscopic -     Vitamin D 25 hydroxy -     EKG 12-Lead -     triamterene-hydrochlorothiazide (DYAZIDE) 37.5-25 MG capsule; Take 1 each (1 capsule total) by mouth daily.  Routine general medical examination at a health care facility-  Exam completed, labs reviewed, vaccines reviewed and updated, Cologuard ordered to screen for colon cancer, mammogram ordered to screen for breast cancer, patient education material was given.  Screen for colon cancer -     Cologuard  Visit for screening mammogram -     MM Digital Screening; Future  Hyperlipidemia LDL goal <130- She has an elevated ASCVD risk score so I have asked her to start taking a statin for CV risk reduction. -     Lipid panel -     TSH -     Hepatic function panel -     rosuvastatin (CRESTOR) 5 MG tablet; Take 1 tablet (5 mg total) by mouth daily.  Need for pneumococcal vaccination -     Pneumococcal polysaccharide vaccine 23-valent greater than or equal to 2yo subcutaneous/IM  Subclinical hypothyroidism- She has a chronic yet stable increase in her TSH but clinically is euthyroid.  Treatment is not indicated.  Deficiency anemia- She has developed a mild anemia.  I have asked her to return to be screened for vitamin deficiencies. -     IBC panel -     B12 -     Vitamin B1 -     Folate -     Ferritin   I have discontinued Brexley Harnois's hydrochlorothiazide. I am also having her start on triamterene-hydrochlorothiazide and rosuvastatin. Additionally, I am having her maintain her aspirin EC, vitamin B-12, and multivitamin.  Meds ordered this encounter  Medications  . triamterene-hydrochlorothiazide (DYAZIDE) 37.5-25 MG capsule    Sig: Take 1 each (1 capsule total) by mouth daily.    Dispense:  90 capsule    Refill:  0  . rosuvastatin (CRESTOR) 5 MG tablet    Sig: Take 1 tablet (5 mg total) by mouth daily.    Dispense:  90 tablet    Refill:  1     Follow-up: Return in about 6 weeks (around 07/04/2019).  Sanda Linger, MD

## 2019-05-23 NOTE — Patient Instructions (Signed)
Health Maintenance, Female Adopting a healthy lifestyle and getting preventive care are important in promoting health and wellness. Ask your health care provider about:  The right schedule for you to have regular tests and exams.  Things you can do on your own to prevent diseases and keep yourself healthy. What should I know about diet, weight, and exercise? Eat a healthy diet   Eat a diet that includes plenty of vegetables, fruits, low-fat dairy products, and lean protein.  Do not eat a lot of foods that are high in solid fats, added sugars, or sodium. Maintain a healthy weight Body mass index (BMI) is used to identify weight problems. It estimates body fat based on height and weight. Your health care provider can help determine your BMI and help you achieve or maintain a healthy weight. Get regular exercise Get regular exercise. This is one of the most important things you can do for your health. Most adults should:  Exercise for at least 150 minutes each week. The exercise should increase your heart rate and make you sweat (moderate-intensity exercise).  Do strengthening exercises at least twice a week. This is in addition to the moderate-intensity exercise.  Spend less time sitting. Even light physical activity can be beneficial. Watch cholesterol and blood lipids Have your blood tested for lipids and cholesterol at 68 years of age, then have this test every 5 years. Have your cholesterol levels checked more often if:  Your lipid or cholesterol levels are high.  You are older than 68 years of age.  You are at high risk for heart disease. What should I know about cancer screening? Depending on your health history and family history, you may need to have cancer screening at various ages. This may include screening for:  Breast cancer.  Cervical cancer.  Colorectal cancer.  Skin cancer.  Lung cancer. What should I know about heart disease, diabetes, and high blood  pressure? Blood pressure and heart disease  High blood pressure causes heart disease and increases the risk of stroke. This is more likely to develop in people who have high blood pressure readings, are of African descent, or are overweight.  Have your blood pressure checked: ? Every 3-5 years if you are 18-39 years of age. ? Every year if you are 40 years old or older. Diabetes Have regular diabetes screenings. This checks your fasting blood sugar level. Have the screening done:  Once every three years after age 40 if you are at a normal weight and have a low risk for diabetes.  More often and at a younger age if you are overweight or have a high risk for diabetes. What should I know about preventing infection? Hepatitis B If you have a higher risk for hepatitis B, you should be screened for this virus. Talk with your health care provider to find out if you are at risk for hepatitis B infection. Hepatitis C Testing is recommended for:  Everyone born from 1945 through 1965.  Anyone with known risk factors for hepatitis C. Sexually transmitted infections (STIs)  Get screened for STIs, including gonorrhea and chlamydia, if: ? You are sexually active and are younger than 68 years of age. ? You are older than 68 years of age and your health care provider tells you that you are at risk for this type of infection. ? Your sexual activity has changed since you were last screened, and you are at increased risk for chlamydia or gonorrhea. Ask your health care provider if   you are at risk.  Ask your health care provider about whether you are at high risk for HIV. Your health care provider may recommend a prescription medicine to help prevent HIV infection. If you choose to take medicine to prevent HIV, you should first get tested for HIV. You should then be tested every 3 months for as long as you are taking the medicine. Pregnancy  If you are about to stop having your period (premenopausal) and  you may become pregnant, seek counseling before you get pregnant.  Take 400 to 800 micrograms (mcg) of folic acid every day if you become pregnant.  Ask for birth control (contraception) if you want to prevent pregnancy. Osteoporosis and menopause Osteoporosis is a disease in which the bones lose minerals and strength with aging. This can result in bone fractures. If you are 65 years old or older, or if you are at risk for osteoporosis and fractures, ask your health care provider if you should:  Be screened for bone loss.  Take a calcium or vitamin D supplement to lower your risk of fractures.  Be given hormone replacement therapy (HRT) to treat symptoms of menopause. Follow these instructions at home: Lifestyle  Do not use any products that contain nicotine or tobacco, such as cigarettes, e-cigarettes, and chewing tobacco. If you need help quitting, ask your health care provider.  Do not use street drugs.  Do not share needles.  Ask your health care provider for help if you need support or information about quitting drugs. Alcohol use  Do not drink alcohol if: ? Your health care provider tells you not to drink. ? You are pregnant, may be pregnant, or are planning to become pregnant.  If you drink alcohol: ? Limit how much you use to 0-1 drink a day. ? Limit intake if you are breastfeeding.  Be aware of how much alcohol is in your drink. In the [place] one drink equals one 12 oz bottle of beer (355 mL), one 5 oz glass of wine (148 mL), or one 1 oz glass of hard liquor (44 mL). General instructions  Schedule regular health, dental, and eye exams.  Stay current with your vaccines.  Tell your health care provider if: ? You often feel depressed. ? You have ever been abused or do not feel safe at home. Summary  Adopting a healthy lifestyle and getting preventive care are important in promoting health and wellness.  Follow your health care provider's instructions about healthy  diet, exercising, and getting tested or screened for diseases.  Follow your health care provider's instructions on monitoring your cholesterol and blood pressure. This information is not intended to replace advice given to you by your health care provider. Make sure you discuss any questions you have with your health care provider. Document Revised: 04/21/2018 Document Reviewed: 04/21/2018 Elsevier Patient Education  2020 [employer]vier Inc.  

## 2019-05-24 DIAGNOSIS — E039 Hypothyroidism, unspecified: Secondary | ICD-10-CM | POA: Insufficient documentation

## 2019-05-24 DIAGNOSIS — Z23 Encounter for immunization: Secondary | ICD-10-CM | POA: Insufficient documentation

## 2019-05-24 DIAGNOSIS — I493 Ventricular premature depolarization: Secondary | ICD-10-CM | POA: Insufficient documentation

## 2019-05-24 DIAGNOSIS — E038 Other specified hypothyroidism: Secondary | ICD-10-CM | POA: Insufficient documentation

## 2019-05-24 DIAGNOSIS — D539 Nutritional anemia, unspecified: Secondary | ICD-10-CM | POA: Insufficient documentation

## 2019-05-24 LAB — TSH: TSH: 5.26 u[IU]/mL — ABNORMAL HIGH (ref 0.35–4.50)

## 2019-05-24 LAB — VITAMIN D 25 HYDROXY (VIT D DEFICIENCY, FRACTURES): VITD: 36.97 ng/mL (ref 30.00–100.00)

## 2019-05-24 MED ORDER — ROSUVASTATIN CALCIUM 5 MG PO TABS: 5.0000 mg | ORAL_TABLET | Freq: Every day | ORAL | 1 refills | Status: DC

## 2019-05-24 NOTE — Assessment & Plan Note (Signed)
She is asymptomatic with respect to this. Treatment is therefore not indicated.

## 2019-05-25 ENCOUNTER — Telehealth: Payer: Self-pay

## 2019-05-25 NOTE — Telephone Encounter (Signed)
Pt notified of results and she verbalized understanding, she scheduled appt for labs on 05/30/2019.

## 2019-05-25 NOTE — Addendum Note (Signed)
Addended by: Frances Nickels on: 05/25/2019 11:08 AM   Modules accepted: Orders

## 2019-05-25 NOTE — Telephone Encounter (Signed)
Pt notified of results and she verbalized understanding, she scheduled appt for labs on 05/30/2019.  

## 2019-05-30 ENCOUNTER — Other Ambulatory Visit: Payer: Medicare Other

## 2019-05-30 ENCOUNTER — Telehealth: Payer: Self-pay

## 2019-05-30 ENCOUNTER — Other Ambulatory Visit: Payer: Self-pay

## 2019-05-30 DIAGNOSIS — D539 Nutritional anemia, unspecified: Secondary | ICD-10-CM

## 2019-05-30 LAB — IBC PANEL
Iron: 106 ug/dL (ref 42–145)
Saturation Ratios: 33.1 % (ref 20.0–50.0)
Transferrin: 229 mg/dL (ref 212.0–360.0)

## 2019-05-30 LAB — VITAMIN B12: Vitamin B-12: 676 pg/mL (ref 211–911)

## 2019-05-30 LAB — FOLATE: Folate: 19.9 ng/mL (ref >5.9)

## 2019-05-30 LAB — FERRITIN: Ferritin: 76.1 ng/mL (ref 10.0–291.0)

## 2019-05-30 NOTE — Telephone Encounter (Signed)
Called pt and she denies any chest pain, HA, blurred vision or SOB.   Pt does not have a way to check BP readings at home. I asked pt to call [facility] and to find out if they will cover a home BP cuff and if so, where would they prefer I send the rx to. Pt agreed and will call back.

## 2019-05-30 NOTE — Telephone Encounter (Signed)
Patient had BP checked while doing lab work today. The reading was 168/90. She has been on a new BP for a week now. Wants to make sure nothing else should be done since it is still reading elevated. She does not have a way to check BP at home so this is the only reading she has since she was seen only 05/23/19.

## 2019-06-02 ENCOUNTER — Other Ambulatory Visit: Payer: Self-pay | Admitting: Internal Medicine

## 2019-06-02 DIAGNOSIS — E519 Thiamine deficiency, unspecified: Secondary | ICD-10-CM | POA: Insufficient documentation

## 2019-06-02 DIAGNOSIS — Z20822 Contact with and (suspected) exposure to covid-19: Secondary | ICD-10-CM | POA: Diagnosis not present

## 2019-06-02 DIAGNOSIS — D538 Other specified nutritional anemias: Secondary | ICD-10-CM

## 2019-06-02 DIAGNOSIS — Z6822 Body mass index (BMI) 22.0-22.9, adult: Secondary | ICD-10-CM | POA: Diagnosis not present

## 2019-06-02 LAB — VITAMIN B1: Vitamin B1 (Thiamine): 8 nmol/L (ref 8–30)

## 2019-06-02 MED ORDER — VITAMIN B-1 50 MG PO TABS: 50.0000 mg | ORAL_TABLET | ORAL | 1 refills | Status: DC

## 2019-06-15 ENCOUNTER — Other Ambulatory Visit: Payer: Self-pay | Admitting: Internal Medicine

## 2019-06-15 DIAGNOSIS — I10 Essential (primary) hypertension: Secondary | ICD-10-CM

## 2019-06-29 ENCOUNTER — Telehealth: Payer: Self-pay

## 2019-06-29 DIAGNOSIS — I10 Essential (primary) hypertension: Secondary | ICD-10-CM

## 2019-06-29 MED ORDER — TRIAMTERENE-HCTZ 37.5-25 MG PO CAPS: 1.0000 | ORAL_CAPSULE | Freq: Every day | ORAL | 1 refills | Status: DC

## 2019-06-29 NOTE — Telephone Encounter (Signed)
erx has been resent.  

## 2019-06-29 NOTE — Telephone Encounter (Signed)
Medication Requested: triamterene-hydrochlorothiazide (DYAZIDE) 37.5-25 MG capsule  Is medication on med list: Yes  (if no, inform pt they may need an appointment)  Is medication a controled: No  (yes = last OV with PCP)  -Controlled Substances: Adderall, Ritalin, oxycodone, hydrocodone, methadone, alprazolam, etc  Last visit with PCP: Seen Dr. Yetta Barre on 05/23/2019   Is the OV > than 4 months: (yes = schedule an appt if one is not already made)  Pharmacy (Name, Street, [place]): [facility] [address]

## 2019-07-04 ENCOUNTER — Ambulatory Visit: Payer: Medicare Other | Admitting: Internal Medicine

## 2019-07-18 ENCOUNTER — Ambulatory Visit (INDEPENDENT_AMBULATORY_CARE_PROVIDER_SITE_OTHER): Payer: Medicare Other | Admitting: Internal Medicine

## 2019-07-18 ENCOUNTER — Encounter: Payer: Self-pay | Admitting: Internal Medicine

## 2019-07-18 ENCOUNTER — Other Ambulatory Visit: Payer: Self-pay

## 2019-07-18 VITALS — BP 136/86 | HR 73 | Temp 98.7°F | Resp 16 | Ht 67.0 in | Wt 138.5 lb

## 2019-07-18 DIAGNOSIS — E519 Thiamine deficiency, unspecified: Secondary | ICD-10-CM | POA: Diagnosis not present

## 2019-07-18 DIAGNOSIS — Z1231 Encounter for screening mammogram for malignant neoplasm of breast: Secondary | ICD-10-CM

## 2019-07-18 DIAGNOSIS — E038 Other specified hypothyroidism: Secondary | ICD-10-CM

## 2019-07-18 DIAGNOSIS — E039 Hypothyroidism, unspecified: Secondary | ICD-10-CM | POA: Diagnosis not present

## 2019-07-18 DIAGNOSIS — Z1211 Encounter for screening for malignant neoplasm of colon: Secondary | ICD-10-CM

## 2019-07-18 DIAGNOSIS — D538 Other specified nutritional anemias: Secondary | ICD-10-CM | POA: Diagnosis not present

## 2019-07-18 DIAGNOSIS — I10 Essential (primary) hypertension: Secondary | ICD-10-CM | POA: Diagnosis not present

## 2019-07-18 LAB — CBC WITH DIFFERENTIAL/PLATELET
Basophils Absolute: 0.1 10*3/uL (ref 0.0–0.1)
Basophils Relative: 1.4 % (ref 0.0–3.0)
Eosinophils Absolute: 0.2 10*3/uL (ref 0.0–0.7)
Eosinophils Relative: 2.7 % (ref 0.0–5.0)
HCT: 36.1 % (ref 36.0–46.0)
Hemoglobin: 11.7 g/dL — ABNORMAL LOW (ref 12.0–15.0)
Lymphocytes Relative: 36.1 % (ref 12.0–46.0)
Lymphs Abs: 3.1 10*3/uL (ref 0.7–4.0)
MCHC: 32.4 g/dL (ref 30.0–36.0)
MCV: 76.8 fl — ABNORMAL LOW (ref 78.0–100.0)
Monocytes Absolute: 0.6 10*3/uL (ref 0.1–1.0)
Monocytes Relative: 7.2 % (ref 3.0–12.0)
Neutro Abs: 4.5 10*3/uL (ref 1.4–7.7)
Neutrophils Relative %: 52.6 % (ref 43.0–77.0)
Platelets: 264 10*3/uL (ref 150.0–400.0)
RBC: 4.69 Mil/uL (ref 3.87–5.11)
RDW: 15.4 % (ref 11.5–15.5)
WBC: 8.6 10*3/uL (ref 4.0–10.5)

## 2019-07-18 LAB — BASIC METABOLIC PANEL
BUN: 16 mg/dL (ref 6–23)
CO2: 27 mEq/L (ref 19–32)
Calcium: 9.6 mg/dL (ref 8.4–10.5)
Chloride: 104 mEq/L (ref 96–112)
Creatinine, Ser: 1.05 mg/dL (ref 0.40–1.20)
GFR: 63.15 mL/min (ref >60.00)
Glucose, Bld: 120 mg/dL — ABNORMAL HIGH (ref 70–99)
Potassium: 3.8 mEq/L (ref 3.5–5.1)
Sodium: 138 mEq/L (ref 135–145)

## 2019-07-18 LAB — TSH: TSH: 3.99 u[IU]/mL (ref 0.35–4.50)

## 2019-07-18 NOTE — Progress Notes (Signed)
Subjective:  Patient ID: Stephanie Calderon, female    DOB: 1950/07/20  Age: 69 y.o. MRN: [id number]  CC: Anemia and Hypertension  This visit occurred during the SARS-CoV-2 public health emergency.  Safety protocols were in place, including screening questions prior to the visit, additional usage of staff PPE, and extensive cleaning of exam room while observing appropriate contact time as indicated for disinfecting solutions.    HPI Stephanie Calderon presents for f/up - She walks quite a bit.  She has good endurance.  She denies any recent episodes of DOE, CP, palpitations, edema, or fatigue.  She tells me her blood pressure is well controlled at home.  Outpatient Medications Prior to Visit  Medication Sig Dispense Refill  . aspirin EC 81 MG tablet Take 81 mg by mouth daily.    . Multiple Vitamin (MULTIVITAMIN) tablet Take 1 tablet by mouth daily.    . rosuvastatin (CRESTOR) 5 MG tablet Take 1 tablet (5 mg total) by mouth daily. 90 tablet 1  . thiamine (VITAMIN B-1) 50 MG tablet Take 1 tablet (50 mg total) by mouth every other day. 45 tablet 1  . triamterene-hydrochlorothiazide (DYAZIDE) 37.5-25 MG capsule Take 1 each (1 capsule total) by mouth daily. 90 capsule 1  . vitamin B-12 (CYANOCOBALAMIN) 1000 MCG tablet Take 1,000 mcg by mouth daily.     No facility-administered medications prior to visit.    ROS Review of Systems  Constitutional: Negative for diaphoresis, fatigue and unexpected weight change.  HENT: Negative.   Eyes: Negative for visual disturbance.  Respiratory: Negative for cough, chest tightness, shortness of breath and wheezing.   Cardiovascular: Negative for chest pain, palpitations and leg swelling.  Gastrointestinal: Negative for abdominal pain, blood in stool, constipation, diarrhea, nausea and vomiting.  Endocrine: Negative.   Genitourinary: Negative.  Negative for difficulty urinating.  Musculoskeletal: Negative.  Negative for arthralgias and myalgias.  Skin: Negative.   Negative for color change.  Neurological: Negative.  Negative for dizziness, weakness, light-headedness, numbness and headaches.  Hematological: Negative for adenopathy. Does not bruise/bleed easily.  Psychiatric/Behavioral: Negative.     Objective:  BP 136/86 (BP Location: Right Arm, Patient Position: Sitting, Cuff Size: Normal)   Pulse 73   Temp 98.7 F (37.1 C) (Oral)   Resp 16   Ht 5\' 7"  (1.702 m)   Wt 138 lb 8 oz (62.8 kg)   SpO2 99%   BMI 21.69 kg/m   BP Readings from Last 3 Encounters:  07/18/19 136/86  05/23/19 (!) 164/90  07/05/18 134/86    Wt Readings from Last 3 Encounters:  07/18/19 138 lb 8 oz (62.8 kg)  05/23/19 143 lb (64.9 kg)  07/05/18 149 lb 4 oz (67.7 kg)    Physical Exam Vitals reviewed.  Constitutional:      Appearance: Normal appearance.  HENT:     Nose: Nose normal.     Mouth/Throat:     Mouth: Mucous membranes are moist.  Eyes:     General: No scleral icterus.    Conjunctiva/sclera: Conjunctivae normal.  Cardiovascular:     Rate and Rhythm: Normal rate and regular rhythm.     Heart sounds: No murmur.  Pulmonary:     Effort: Pulmonary effort is normal.     Breath sounds: No stridor. No wheezing, rhonchi or rales.  Abdominal:     General: Abdomen is flat. Bowel sounds are normal. There is no distension.     Palpations: Abdomen is soft. There is no hepatomegaly, splenomegaly or mass.  Tenderness: There is no abdominal tenderness.  Musculoskeletal:        General: Normal range of motion.     Cervical back: Neck supple.     Right lower leg: No edema.     Left lower leg: No edema.  Lymphadenopathy:     Cervical: No cervical adenopathy.  Skin:    General: Skin is warm and dry.     Coloration: Skin is not pale.  Neurological:     General: No focal deficit present.     Mental Status: She is alert.  Psychiatric:        Mood and Affect: Mood normal.        Behavior: Behavior normal.     Lab Results  Component Value Date   WBC 8.6  07/18/2019   HGB 11.7 (L) 07/18/2019   HCT 36.1 07/18/2019   PLT 264.0 07/18/2019   GLUCOSE 120 (H) 07/18/2019   CHOL 197 05/23/2019   TRIG 96.0 05/23/2019   HDL 86.00 05/23/2019   LDLDIRECT 81.0 07/24/2015   LDLCALC 91 05/23/2019   ALT 18 05/23/2019   AST 20 05/23/2019   NA 138 07/18/2019   K 3.8 07/18/2019   CL 104 07/18/2019   CREATININE 1.05 07/18/2019   BUN 16 07/18/2019   CO2 27 07/18/2019   TSH 3.99 07/18/2019    MM 3D SCREEN BREAST BILATERAL  Result Date: 11/20/2017 CLINICAL DATA:  Screening. EXAM: DIGITAL SCREENING BILATERAL MAMMOGRAM WITH TOMO AND CAD COMPARISON:  Previous exam(s). ACR Breast Density Category b: There are scattered areas of fibroglandular density. FINDINGS: There are no findings suspicious for malignancy. Images were processed with CAD. IMPRESSION: No mammographic evidence of malignancy. A result letter of this screening mammogram will be mailed directly to the patient. RECOMMENDATION: Screening mammogram in one year. (Code:SM-B-01Y) BI-RADS CATEGORY  1: Negative. Electronically Signed   By: Hulan Saas M.D.   On: 11/20/2017 15:36    Assessment & Plan:   Namine was seen today for anemia and hypertension.  Diagnoses and all orders for this visit:  Essential hypertension- Her blood pressure is adequately well controlled.  Electrolytes and renal function are normal. -     Basic metabolic panel  Screen for colon cancer -     Ambulatory referral to Gastroenterology  Visit for screening mammogram -     MM DIGITAL SCREENING BILATERAL; Future  Anemia due to acquired thiamine deficiency- Her anemia has not improved much despite thiamine replacement therapy.  I have asked her to return for further investigation of this. -     CBC with Differential/Platelet  Subclinical hypothyroidism- Her TSH is very mildly elevated.  Clinically she appears euthyroid.  Thyroid replacement therapy is not indicated. -     TSH   I am having Stephanie Calderon maintain her  aspirin EC, vitamin B-12, multivitamin, rosuvastatin, thiamine, and triamterene-hydrochlorothiazide.  No orders of the defined types were placed in this encounter.    Follow-up: Return in about 6 months (around 01/18/2020).  Sanda Linger, MD

## 2019-07-18 NOTE — Patient Instructions (Signed)

## 2019-07-19 ENCOUNTER — Encounter: Payer: Self-pay | Admitting: Internal Medicine

## 2019-07-22 ENCOUNTER — Encounter: Payer: Self-pay | Admitting: Gastroenterology

## 2019-08-18 ENCOUNTER — Other Ambulatory Visit: Payer: Self-pay | Admitting: Internal Medicine

## 2019-08-18 DIAGNOSIS — I10 Essential (primary) hypertension: Secondary | ICD-10-CM

## 2019-08-19 ENCOUNTER — Other Ambulatory Visit: Payer: Self-pay | Admitting: Internal Medicine

## 2019-08-19 DIAGNOSIS — I10 Essential (primary) hypertension: Secondary | ICD-10-CM

## 2019-08-23 ENCOUNTER — Other Ambulatory Visit: Payer: Self-pay | Admitting: Internal Medicine

## 2019-08-23 DIAGNOSIS — I10 Essential (primary) hypertension: Secondary | ICD-10-CM

## 2019-09-19 ENCOUNTER — Encounter: Payer: Medicare Other | Admitting: Gastroenterology

## 2019-09-22 ENCOUNTER — Ambulatory Visit: Payer: Medicare Other

## 2019-10-18 ENCOUNTER — Ambulatory Visit: Payer: Medicare Other

## 2019-10-25 ENCOUNTER — Other Ambulatory Visit: Payer: Self-pay

## 2019-10-25 ENCOUNTER — Ambulatory Visit
Admission: RE | Admit: 2019-10-25 | Discharge: 2019-10-25 | Disposition: A | Discharge status: Home / Self Care | Payer: Medicare Other | Source: Ambulatory Visit | Attending: Internal Medicine | Admitting: Internal Medicine

## 2019-10-25 DIAGNOSIS — Z1231 Encounter for screening mammogram for malignant neoplasm of breast: Secondary | ICD-10-CM

## 2019-10-27 LAB — HM MAMMOGRAPHY

## 2019-12-03 DIAGNOSIS — Z20822 Contact with and (suspected) exposure to covid-19: Secondary | ICD-10-CM | POA: Diagnosis not present

## 2019-12-05 ENCOUNTER — Telehealth: Payer: Self-pay

## 2019-12-05 NOTE — Telephone Encounter (Signed)
New message    [facility] [address], wanted to know what medication she should take.   [id number] [place], [place] - [address]

## 2019-12-06 NOTE — Telephone Encounter (Signed)
New Message:   Pt is calling and states she is needing something for her cough since she has tested positive for Covid. Please advise.

## 2019-12-06 NOTE — Telephone Encounter (Signed)
New Message:   Pt is calling and states she was told by the pharmacy to call and get Dr. Yetta Calderon to prescribe her some medication since she tested positive on Saturday. Pt states she is having a little trouble breathing when she is up too long. She states she also is having some diarrhea. Please advise.   [id number] - [place], [place] - [address]

## 2019-12-06 NOTE — Telephone Encounter (Signed)
Pt contacted and requested rx for abx. Informed that we were not able to rx an abx and that she should manage sx at home. Informed pt that if she has any sx of CP, SOB or DOE then she should go to [facility] hospital ASAP.   Pt stated understanding and will let us know if she has any additional questions.

## 2020-01-12 ENCOUNTER — Other Ambulatory Visit: Payer: Self-pay | Admitting: Internal Medicine

## 2020-01-12 DIAGNOSIS — E785 Hyperlipidemia, unspecified: Secondary | ICD-10-CM

## 2020-05-20 ENCOUNTER — Other Ambulatory Visit: Payer: Self-pay | Admitting: Internal Medicine

## 2020-05-20 DIAGNOSIS — I10 Essential (primary) hypertension: Secondary | ICD-10-CM

## 2020-06-11 ENCOUNTER — Encounter: Payer: Medicare Other | Admitting: Internal Medicine

## 2020-06-19 ENCOUNTER — Other Ambulatory Visit: Payer: Self-pay

## 2020-06-19 ENCOUNTER — Encounter: Payer: Self-pay | Admitting: Internal Medicine

## 2020-06-19 ENCOUNTER — Ambulatory Visit (INDEPENDENT_AMBULATORY_CARE_PROVIDER_SITE_OTHER): Payer: Medicare Other | Admitting: Internal Medicine

## 2020-06-19 VITALS — BP 126/86 | HR 68 | Temp 98.0°F | Resp 16 | Ht 67.0 in | Wt 139.0 lb

## 2020-06-19 DIAGNOSIS — E785 Hyperlipidemia, unspecified: Secondary | ICD-10-CM | POA: Diagnosis not present

## 2020-06-19 DIAGNOSIS — D538 Other specified nutritional anemias: Secondary | ICD-10-CM

## 2020-06-19 DIAGNOSIS — I10 Essential (primary) hypertension: Secondary | ICD-10-CM | POA: Diagnosis not present

## 2020-06-19 DIAGNOSIS — Z0000 Encounter for general adult medical examination without abnormal findings: Secondary | ICD-10-CM

## 2020-06-19 DIAGNOSIS — E038 Other specified hypothyroidism: Secondary | ICD-10-CM

## 2020-06-19 DIAGNOSIS — Z1211 Encounter for screening for malignant neoplasm of colon: Secondary | ICD-10-CM

## 2020-06-19 DIAGNOSIS — D539 Nutritional anemia, unspecified: Secondary | ICD-10-CM

## 2020-06-19 DIAGNOSIS — E519 Thiamine deficiency, unspecified: Secondary | ICD-10-CM

## 2020-06-19 LAB — URINALYSIS, ROUTINE W REFLEX MICROSCOPIC
Bilirubin Urine: NEGATIVE
Hgb urine dipstick: NEGATIVE
Ketones, ur: NEGATIVE
Leukocytes,Ua: NEGATIVE
Nitrite: NEGATIVE
RBC / HPF: NONE SEEN (ref 0–?)
Specific Gravity, Urine: 1.025 (ref 1.000–1.030)
Total Protein, Urine: NEGATIVE
Urine Glucose: NEGATIVE
Urobilinogen, UA: 0.2 (ref 0.0–1.0)
pH: 6 (ref 5.0–8.0)

## 2020-06-19 LAB — LIPID PANEL
Cholesterol: 177 mg/dL (ref 0–200)
HDL: 73 mg/dL (ref >39.00)
LDL Cholesterol: 77 mg/dL (ref 0–99)
NonHDL: 104.14
Total CHOL/HDL Ratio: 2
Triglycerides: 138 mg/dL (ref 0.0–149.0)
VLDL: 27.6 mg/dL (ref 0.0–40.0)

## 2020-06-19 LAB — CBC WITH DIFFERENTIAL/PLATELET
Basophils Absolute: 0.1 10*3/uL (ref 0.0–0.1)
Basophils Relative: 0.7 % (ref 0.0–3.0)
Eosinophils Absolute: 0.3 10*3/uL (ref 0.0–0.7)
Eosinophils Relative: 4.8 % (ref 0.0–5.0)
HCT: 37.3 % (ref 36.0–46.0)
Hemoglobin: 11.9 g/dL — ABNORMAL LOW (ref 12.0–15.0)
Lymphocytes Relative: 34.9 % (ref 12.0–46.0)
Lymphs Abs: 2.5 10*3/uL (ref 0.7–4.0)
MCHC: 32 g/dL (ref 30.0–36.0)
MCV: 76.2 fl — ABNORMAL LOW (ref 78.0–100.0)
Monocytes Absolute: 0.6 10*3/uL (ref 0.1–1.0)
Monocytes Relative: 8.7 % (ref 3.0–12.0)
Neutro Abs: 3.7 10*3/uL (ref 1.4–7.7)
Neutrophils Relative %: 50.9 % (ref 43.0–77.0)
Platelets: 262 10*3/uL (ref 150.0–400.0)
RBC: 4.89 Mil/uL (ref 3.87–5.11)
RDW: 16 % — ABNORMAL HIGH (ref 11.5–15.5)
WBC: 7.2 10*3/uL (ref 4.0–10.5)

## 2020-06-19 LAB — BASIC METABOLIC PANEL
BUN: 15 mg/dL (ref 6–23)
CO2: 30 mEq/L (ref 19–32)
Calcium: 9.9 mg/dL (ref 8.4–10.5)
Chloride: 103 mEq/L (ref 96–112)
Creatinine, Ser: 0.79 mg/dL (ref 0.40–1.20)
GFR: 76.85 mL/min (ref >60.00)
Glucose, Bld: 86 mg/dL (ref 70–99)
Potassium: 4.3 mEq/L (ref 3.5–5.1)
Sodium: 139 mEq/L (ref 135–145)

## 2020-06-19 LAB — HEPATIC FUNCTION PANEL
ALT: 15 U/L (ref 0–35)
AST: 20 U/L (ref 0–37)
Albumin: 4.3 g/dL (ref 3.5–5.2)
Alkaline Phosphatase: 53 U/L (ref 39–117)
Bilirubin, Direct: 0 mg/dL (ref 0.0–0.3)
Total Bilirubin: 0.4 mg/dL (ref 0.2–1.2)
Total Protein: 7.5 g/dL (ref 6.0–8.3)

## 2020-06-19 LAB — TSH: TSH: 2.42 u[IU]/mL (ref 0.35–4.50)

## 2020-06-19 NOTE — Progress Notes (Signed)
Subjective:  Patient ID: Stephanie Calderon, female    DOB: [birth year]  Age: 70 y.o. MRN: [id number]  CC: Annual Exam, Anemia, Hypertension, and Hyperlipidemia  This visit occurred during the SARS-CoV-2 public health emergency.  Safety protocols were in place, including screening questions prior to the visit, additional usage of staff PPE, and extensive cleaning of exam room while observing appropriate contact time as indicated for disinfecting solutions.    HPI Stephanie Calderon presents for a CPX.  She tells me her blood pressure has been well controlled. She is active and denies any recent episodes of chest pain, shortness of breath, palpitations, edema, or fatigue.  Outpatient Medications Prior to Visit  Medication Sig Dispense Refill  . aspirin EC 81 MG tablet Take 81 mg by mouth daily.    . Multiple Vitamin (MULTIVITAMIN) tablet Take 1 tablet by mouth daily.    Marland Kitchen thiamine (VITAMIN B-1) 50 MG tablet Take 1 tablet (50 mg total) by mouth every other day. 45 tablet 1  . vitamin B-12 (CYANOCOBALAMIN) 1000 MCG tablet Take 1,000 mcg by mouth daily.    . rosuvastatin (CRESTOR) 5 MG tablet TAKE 1 TABLET BY MOUTH EVERY DAY 90 tablet 1  . triamterene-hydrochlorothiazide (DYAZIDE) 37.5-25 MG capsule Take 1 each (1 capsule total) by mouth daily. 90 capsule 1   No facility-administered medications prior to visit.    ROS Review of Systems  Constitutional: Negative.  Negative for appetite change, diaphoresis, fatigue and unexpected weight change.  HENT: Negative.   Eyes: Negative for visual disturbance.  Respiratory: Negative for cough, chest tightness, shortness of breath and wheezing.   Cardiovascular: Negative for chest pain, palpitations and leg swelling.  Gastrointestinal: Negative for abdominal pain, constipation, diarrhea, nausea and vomiting.  Endocrine: Negative.   Genitourinary: Negative.  Negative for difficulty urinating, dysuria and hematuria.  Musculoskeletal: Negative.  Negative for  back pain and myalgias.  Neurological: Negative.  Negative for dizziness, weakness, light-headedness and headaches.  Hematological: Negative for adenopathy. Does not bruise/bleed easily.  Psychiatric/Behavioral: Negative.     Objective:  BP 126/86   Pulse 68   Temp 98 F (36.7 C) (Oral)   Resp 16   Ht 5\' 7"  (1.702 m)   Wt 139 lb (63 kg)   SpO2 99%   BMI 21.77 kg/m   BP Readings from Last 3 Encounters:  06/19/20 126/86  07/18/19 136/86  05/23/19 (!) 164/90    Wt Readings from Last 3 Encounters:  06/19/20 139 lb (63 kg)  07/18/19 138 lb 8 oz (62.8 kg)  05/23/19 143 lb (64.9 kg)    Physical Exam Vitals reviewed.  Constitutional:      Appearance: Normal appearance.  HENT:     Nose: Nose normal.     Mouth/Throat:     Mouth: Mucous membranes are moist.  Eyes:     General: No scleral icterus.    Conjunctiva/sclera: Conjunctivae normal.  Cardiovascular:     Rate and Rhythm: Normal rate and regular rhythm.     Heart sounds: No murmur heard.   Pulmonary:     Effort: Pulmonary effort is normal.     Breath sounds: No stridor. No wheezing, rhonchi or rales.  Abdominal:     General: Abdomen is flat. Bowel sounds are normal. There is no distension.     Palpations: Abdomen is soft. There is no hepatomegaly, splenomegaly or mass.     Tenderness: There is no abdominal tenderness.  Musculoskeletal:        General: Normal range of  motion.     Cervical back: Neck supple.     Right lower leg: No edema.     Left lower leg: No edema.  Lymphadenopathy:     Cervical: No cervical adenopathy.  Skin:    General: Skin is warm and dry.     Coloration: Skin is not pale.  Neurological:     General: No focal deficit present.     Mental Status: She is alert.  Psychiatric:        Mood and Affect: Mood normal.        Behavior: Behavior normal.     Lab Results  Component Value Date   WBC 7.2 06/19/2020   HGB 11.9 (L) 06/19/2020   HCT 37.3 06/19/2020   PLT 262.0 06/19/2020    GLUCOSE 86 06/19/2020   CHOL 177 06/19/2020   TRIG 138.0 06/19/2020   HDL 73.00 06/19/2020   LDLDIRECT 81.0 07/24/2015   LDLCALC 77 06/19/2020   ALT 15 06/19/2020   AST 20 06/19/2020   NA 139 06/19/2020   K 4.3 06/19/2020   CL 103 06/19/2020   CREATININE 0.79 06/19/2020   BUN 15 06/19/2020   CO2 30 06/19/2020   TSH 2.42 06/19/2020    MM 3D SCREEN BREAST BILATERAL  Result Date: 10/27/2019 CLINICAL DATA:  Screening. EXAM: DIGITAL SCREENING BILATERAL MAMMOGRAM WITH TOMO AND CAD COMPARISON:  Previous exam(s). ACR Breast Density Category b: There are scattered areas of fibroglandular density. FINDINGS: There are no findings suspicious for malignancy. Images were processed with CAD. IMPRESSION: No mammographic evidence of malignancy. A result letter of this screening mammogram will be mailed directly to the patient. RECOMMENDATION: Screening mammogram in one year. (Code:SM-B-01Y) BI-RADS CATEGORY  1: Negative. Electronically Signed   By: Emmaline Kluver M.D.   On: 10/27/2019 09:36    Assessment & Plan:   Stephanie Calderon was seen today for annual exam, anemia, hypertension and hyperlipidemia.  Diagnoses and all orders for this visit:  Essential hypertension- Her blood pressure is adequately well controlled. Will continue the combination of triamterene and hydrochlorothiazide. -     CBC with Differential/Platelet; Future -     Basic metabolic panel; Future -     Urinalysis, Routine w reflex microscopic; Future -     Urinalysis, Routine w reflex microscopic -     Basic metabolic panel -     CBC with Differential/Platelet -     triamterene-hydrochlorothiazide (DYAZIDE) 37.5-25 MG capsule; Take 1 each (1 capsule total) by mouth daily.  Subclinical hypothyroidism- Her TSH is in the normal range and clinically she is euthyroid. -     TSH; Future -     TSH  Anemia due to acquired thiamine deficiency- She remains anemic. I will monitor her B1 level. -     Vitamin B1; Future  Hyperlipidemia LDL  goal <130- I recommended that she take a statin for cardiovascular risk reduction. -     Lipid panel; Future -     TSH; Future -     Hepatic function panel; Future -     Hepatic function panel -     TSH -     Lipid panel -     rosuvastatin (CRESTOR) 5 MG tablet; Take 1 tablet (5 mg total) by mouth daily.  Routine general medical examination at a health care facility- Exam completed, labs reviewed, vaccines reviewed - She is not willing to get a flu vaccine, cancer screenings addressed, patient education material was given.  Screen for colon cancer -  Cologuard  Deficiency anemia- I will screen her for vitamin deficiencies. -     Iron; Future -     Vitamin B12; Future -     Folate; Future -     Ferritin; Future -     Vitamin B1; Future -     Reticulocytes; Future -     Haptoglobin; Future -     Lactate dehydrogenase; Future   I have changed Stephanie Calderon's rosuvastatin. I am also having her maintain her aspirin EC, vitamin B-12, multivitamin, thiamine, and triamterene-hydrochlorothiazide.  Meds ordered this encounter  Medications  . rosuvastatin (CRESTOR) 5 MG tablet    Sig: Take 1 tablet (5 mg total) by mouth daily.    Dispense:  90 tablet    Refill:  1  . triamterene-hydrochlorothiazide (DYAZIDE) 37.5-25 MG capsule    Sig: Take 1 each (1 capsule total) by mouth daily.    Dispense:  90 capsule    Refill:  1     Follow-up: Return in about 6 months (around 12/17/2020).  Sanda Linger, MD

## 2020-06-19 NOTE — Patient Instructions (Signed)
Health Maintenance, Female Adopting a healthy lifestyle and getting preventive care are important in promoting health and wellness. Ask your health care provider about:  The right schedule for you to have regular tests and exams.  Things you can do on your own to prevent diseases and keep yourself healthy. What should I know about diet, weight, and exercise? Eat a healthy diet  Eat a diet that includes plenty of vegetables, fruits, low-fat dairy products, and lean protein.  Do not eat a lot of foods that are high in solid fats, added sugars, or sodium.   Maintain a healthy weight Body mass index (BMI) is used to identify weight problems. It estimates body fat based on height and weight. Your health care provider can help determine your BMI and help you achieve or maintain a healthy weight. Get regular exercise Get regular exercise. This is one of the most important things you can do for your health. Most adults should:  Exercise for at least 150 minutes each week. The exercise should increase your heart rate and make you sweat (moderate-intensity exercise).  Do strengthening exercises at least twice a week. This is in addition to the moderate-intensity exercise.  Spend less time sitting. Even light physical activity can be beneficial. Watch cholesterol and blood lipids Have your blood tested for lipids and cholesterol at 69 years of age, then have this test every 5 years. Have your cholesterol levels checked more often if:  Your lipid or cholesterol levels are high.  You are older than 69 years of age.  You are at high risk for heart disease. What should I know about cancer screening? Depending on your health history and family history, you may need to have cancer screening at various ages. This may include screening for:  Breast cancer.  Cervical cancer.  Colorectal cancer.  Skin cancer.  Lung cancer. What should I know about heart disease, diabetes, and high blood  pressure? Blood pressure and heart disease  High blood pressure causes heart disease and increases the risk of stroke. This is more likely to develop in people who have high blood pressure readings, are of African descent, or are overweight.  Have your blood pressure checked: ? Every 3-5 years if you are 18-39 years of age. ? Every year if you are 40 years old or older. Diabetes Have regular diabetes screenings. This checks your fasting blood sugar level. Have the screening done:  Once every three years after age 40 if you are at a normal weight and have a low risk for diabetes.  More often and at a younger age if you are overweight or have a high risk for diabetes. What should I know about preventing infection? Hepatitis B If you have a higher risk for hepatitis B, you should be screened for this virus. Talk with your health care provider to find out if you are at risk for hepatitis B infection. Hepatitis C Testing is recommended for:  Everyone born from 1945 through 1965.  Anyone with known risk factors for hepatitis C. Sexually transmitted infections (STIs)  Get screened for STIs, including gonorrhea and chlamydia, if: ? You are sexually active and are younger than 69 years of age. ? You are older than 69 years of age and your health care provider tells you that you are at risk for this type of infection. ? Your sexual activity has changed since you were last screened, and you are at increased risk for chlamydia or gonorrhea. Ask your health care provider   if you are at risk.  Ask your health care provider about whether you are at high risk for HIV. Your health care provider may recommend a prescription medicine to help prevent HIV infection. If you choose to take medicine to prevent HIV, you should first get tested for HIV. You should then be tested every 3 months for as long as you are taking the medicine. Pregnancy  If you are about to stop having your period (premenopausal) and  you may become pregnant, seek counseling before you get pregnant.  Take 400 to 800 micrograms (mcg) of folic acid every day if you become pregnant.  Ask for birth control (contraception) if you want to prevent pregnancy. Osteoporosis and menopause Osteoporosis is a disease in which the bones lose minerals and strength with aging. This can result in bone fractures. If you are 65 years old or older, or if you are at risk for osteoporosis and fractures, ask your health care provider if you should:  Be screened for bone loss.  Take a calcium or vitamin D supplement to lower your risk of fractures.  Be given hormone replacement therapy (HRT) to treat symptoms of menopause. Follow these instructions at home: Lifestyle  Do not use any products that contain nicotine or tobacco, such as cigarettes, e-cigarettes, and chewing tobacco. If you need help quitting, ask your health care provider.  Do not use street drugs.  Do not share needles.  Ask your health care provider for help if you need support or information about quitting drugs. Alcohol use  Do not drink alcohol if: ? Your health care provider tells you not to drink. ? You are pregnant, may be pregnant, or are planning to become pregnant.  If you drink alcohol: ? Limit how much you use to 0-1 drink a day. ? Limit intake if you are breastfeeding.  Be aware of how much alcohol is in your drink. In the [place] one drink equals one 12 oz bottle of beer (355 mL), one 5 oz glass of wine (148 mL), or one 1 oz glass of hard liquor (44 mL). General instructions  Schedule regular health, dental, and eye exams.  Stay current with your vaccines.  Tell your health care provider if: ? You often feel depressed. ? You have ever been abused or do not feel safe at home. Summary  Adopting a healthy lifestyle and getting preventive care are important in promoting health and wellness.  Follow your health care provider's instructions about healthy  diet, exercising, and getting tested or screened for diseases.  Follow your health care provider's instructions on monitoring your cholesterol and blood pressure. This information is not intended to replace advice given to you by your health care provider. Make sure you discuss any questions you have with your health care provider. Document Revised: 04/21/2018 Document Reviewed: 04/21/2018 Elsevier Patient Education  2021 [employer]  

## 2020-06-20 ENCOUNTER — Other Ambulatory Visit: Payer: Self-pay | Admitting: Internal Medicine

## 2020-06-20 DIAGNOSIS — I10 Essential (primary) hypertension: Secondary | ICD-10-CM

## 2020-06-20 MED ORDER — ROSUVASTATIN CALCIUM 5 MG PO TABS: 5.0000 mg | ORAL_TABLET | Freq: Every day | ORAL | 1 refills | Status: DC

## 2020-06-20 MED ORDER — TRIAMTERENE-HCTZ 37.5-25 MG PO CAPS: 1.0000 | ORAL_CAPSULE | Freq: Every day | ORAL | 1 refills | Status: DC

## 2020-06-21 ENCOUNTER — Other Ambulatory Visit: Payer: Medicare Other

## 2020-06-21 ENCOUNTER — Other Ambulatory Visit (INDEPENDENT_AMBULATORY_CARE_PROVIDER_SITE_OTHER): Payer: Medicare Other

## 2020-06-21 DIAGNOSIS — E519 Thiamine deficiency, unspecified: Secondary | ICD-10-CM

## 2020-06-21 DIAGNOSIS — D538 Other specified nutritional anemias: Secondary | ICD-10-CM

## 2020-06-21 DIAGNOSIS — D539 Nutritional anemia, unspecified: Secondary | ICD-10-CM | POA: Diagnosis not present

## 2020-06-21 LAB — VITAMIN B12: Vitamin B-12: 633 pg/mL (ref 211–911)

## 2020-06-21 LAB — FOLATE: Folate: >23.6 ng/mL (ref >5.9)

## 2020-06-21 LAB — FERRITIN: Ferritin: 50.8 ng/mL (ref 10.0–291.0)

## 2020-06-21 LAB — IRON: Iron: 131 ug/dL (ref 42–145)

## 2020-06-25 ENCOUNTER — Encounter: Payer: Self-pay | Admitting: Internal Medicine

## 2020-06-27 ENCOUNTER — Other Ambulatory Visit: Payer: Medicare Other

## 2020-06-27 DIAGNOSIS — D539 Nutritional anemia, unspecified: Secondary | ICD-10-CM

## 2020-06-28 LAB — EXTRA SPECIMEN

## 2020-06-28 LAB — RETICULOCYTES
ABS Retic: 82800 cells/uL — ABNORMAL HIGH (ref 20000–8000)
Retic Ct Pct: 1.8 %

## 2020-06-28 LAB — HAPTOGLOBIN: Haptoglobin: 160 mg/dL (ref 43–212)

## 2020-06-28 LAB — LACTATE DEHYDROGENASE: LDH: 149 U/L (ref 120–250)

## 2020-06-28 LAB — VITAMIN B1

## 2020-12-18 ENCOUNTER — Telehealth: Payer: Self-pay | Admitting: Internal Medicine

## 2020-12-18 NOTE — Telephone Encounter (Signed)
Left message for patient to call me back at [phone] to schedule Medicare Annual Wellness Visit.  Last AWV  09/20/18  Please schedule AWVS at anytime with [facility] Advisor if patient calls the office back.  45 Minutes appointment   Any questions, please call me at [phone]

## 2020-12-28 ENCOUNTER — Ambulatory Visit: Payer: Self-pay

## 2021-01-08 ENCOUNTER — Ambulatory Visit: Payer: Self-pay

## 2021-01-21 ENCOUNTER — Other Ambulatory Visit: Payer: Self-pay | Admitting: Internal Medicine

## 2021-01-21 DIAGNOSIS — Z1231 Encounter for screening mammogram for malignant neoplasm of breast: Secondary | ICD-10-CM

## 2021-01-24 ENCOUNTER — Telehealth: Payer: Self-pay | Admitting: Internal Medicine

## 2021-01-24 DIAGNOSIS — I10 Essential (primary) hypertension: Secondary | ICD-10-CM

## 2021-01-24 NOTE — Telephone Encounter (Signed)
Denied until visit. 

## 2021-01-24 NOTE — Telephone Encounter (Signed)
Scheduled OV for 02/05/2021. Requesting a limited supply to hold her over.   Please advise.

## 2021-02-05 ENCOUNTER — Encounter: Payer: Self-pay | Admitting: Internal Medicine

## 2021-02-05 ENCOUNTER — Ambulatory Visit (INDEPENDENT_AMBULATORY_CARE_PROVIDER_SITE_OTHER): Payer: Medicare (Managed Care) | Admitting: Internal Medicine

## 2021-02-05 ENCOUNTER — Other Ambulatory Visit: Payer: Self-pay

## 2021-02-05 VITALS — BP 160/92 | HR 63 | Temp 98.1°F | Resp 16 | Ht 67.0 in | Wt 142.2 lb

## 2021-02-05 DIAGNOSIS — E038 Other specified hypothyroidism: Secondary | ICD-10-CM | POA: Diagnosis not present

## 2021-02-05 DIAGNOSIS — Z1231 Encounter for screening mammogram for malignant neoplasm of breast: Secondary | ICD-10-CM

## 2021-02-05 DIAGNOSIS — Z1211 Encounter for screening for malignant neoplasm of colon: Secondary | ICD-10-CM

## 2021-02-05 DIAGNOSIS — E519 Thiamine deficiency, unspecified: Secondary | ICD-10-CM

## 2021-02-05 DIAGNOSIS — D538 Other specified nutritional anemias: Secondary | ICD-10-CM

## 2021-02-05 DIAGNOSIS — D539 Nutritional anemia, unspecified: Secondary | ICD-10-CM

## 2021-02-05 DIAGNOSIS — I10 Essential (primary) hypertension: Secondary | ICD-10-CM | POA: Diagnosis not present

## 2021-02-05 LAB — CBC WITH DIFFERENTIAL/PLATELET
Basophils Absolute: 0.1 10*3/uL (ref 0.0–0.1)
Basophils Relative: 0.8 % (ref 0.0–3.0)
Eosinophils Absolute: 0.4 10*3/uL (ref 0.0–0.7)
Eosinophils Relative: 6.4 % — ABNORMAL HIGH (ref 0.0–5.0)
HCT: 36.2 % (ref 36.0–46.0)
Hemoglobin: 11.4 g/dL — ABNORMAL LOW (ref 12.0–15.0)
Lymphocytes Relative: 34.8 % (ref 12.0–46.0)
Lymphs Abs: 2.3 10*3/uL (ref 0.7–4.0)
MCHC: 31.5 g/dL (ref 30.0–36.0)
MCV: 76.7 fl — ABNORMAL LOW (ref 78.0–100.0)
Monocytes Absolute: 0.5 10*3/uL (ref 0.1–1.0)
Monocytes Relative: 7.5 % (ref 3.0–12.0)
Neutro Abs: 3.4 10*3/uL (ref 1.4–7.7)
Neutrophils Relative %: 50.5 % (ref 43.0–77.0)
Platelets: 237 10*3/uL (ref 150.0–400.0)
RBC: 4.72 Mil/uL (ref 3.87–5.11)
RDW: 16.5 % — ABNORMAL HIGH (ref 11.5–15.5)
WBC: 6.7 10*3/uL (ref 4.0–10.5)

## 2021-02-05 LAB — BASIC METABOLIC PANEL
BUN: 13 mg/dL (ref 6–23)
CO2: 26 mEq/L (ref 19–32)
Calcium: 9.7 mg/dL (ref 8.4–10.5)
Chloride: 103 mEq/L (ref 96–112)
Creatinine, Ser: 0.73 mg/dL (ref 0.40–1.20)
GFR: 84.11 mL/min (ref >60.00)
Glucose, Bld: 93 mg/dL (ref 70–99)
Potassium: 4.4 mEq/L (ref 3.5–5.1)
Sodium: 138 mEq/L (ref 135–145)

## 2021-02-05 LAB — TSH: TSH: 2.75 u[IU]/mL (ref 0.35–5.50)

## 2021-02-05 MED ORDER — TRIAMTERENE-HCTZ 37.5-25 MG PO CAPS: 1.0000 | ORAL_CAPSULE | Freq: Every day | ORAL | 1 refills | Status: DC

## 2021-02-05 MED ORDER — VITAMIN B-1 50 MG PO TABS: 50.0000 mg | ORAL_TABLET | ORAL | 1 refills | Status: DC

## 2021-02-05 NOTE — Progress Notes (Signed)
Subjective:  Patient ID: Stephanie Calderon, female    DOB: [birth year]  Age: 70 y.o. MRN: [id number]  CC: Hypertension, Anemia, and Hypothyroidism  This visit occurred during the SARS-CoV-2 public health emergency.  Safety protocols were in place, including screening questions prior to the visit, additional usage of staff PPE, and extensive cleaning of exam room while observing appropriate contact time as indicated for disinfecting solutions.    HPI Stephanie Calderon presents for f/up -   She has not taken her antihypertensive in 3 weeks.  She walks several miles a day and does not experience chest pain, shortness of breath, diaphoresis, dizziness, or lightheadedness.  Outpatient Medications Prior to Visit  Medication Sig Dispense Refill   aspirin EC 81 MG tablet Take 81 mg by mouth daily.     ferrous sulfate 325 (65 FE) MG tablet Take 325 mg by mouth daily with breakfast.     Multiple Vitamin (MULTIVITAMIN) tablet Take 1 tablet by mouth daily.     rosuvastatin (CRESTOR) 5 MG tablet Take 1 tablet (5 mg total) by mouth daily. 90 tablet 1   vitamin B-12 (CYANOCOBALAMIN) 1000 MCG tablet Take 1,000 mcg by mouth daily.     thiamine (VITAMIN B-1) 50 MG tablet Take 1 tablet (50 mg total) by mouth every other day. 45 tablet 1   triamterene-hydrochlorothiazide (DYAZIDE) 37.5-25 MG capsule Take 1 each (1 capsule total) by mouth daily. 90 capsule 1   No facility-administered medications prior to visit.    ROS Review of Systems  Constitutional:  Negative for diaphoresis and fatigue.  HENT:  Negative for trouble swallowing.   Eyes: Negative.   Respiratory:  Negative for cough, chest tightness, shortness of breath and wheezing.   Cardiovascular:  Negative for chest pain, palpitations and leg swelling.  Gastrointestinal:  Negative for abdominal pain, constipation, diarrhea, nausea and vomiting.  Endocrine: Negative.   Genitourinary: Negative.  Negative for difficulty urinating and hematuria.   Musculoskeletal:  Negative for arthralgias and myalgias.  Skin: Negative.  Negative for color change and pallor.  Neurological: Negative.  Negative for dizziness, weakness, light-headedness and headaches.  Hematological:  Negative for adenopathy. Does not bruise/bleed easily.  Psychiatric/Behavioral: Negative.     Objective:  BP (!) 160/92 (BP Location: Right Arm, Patient Position: Sitting, Cuff Size: Normal)   Pulse 63   Temp 98.1 F (36.7 C) (Oral)   Resp 16   Ht 5\' 7"  (1.702 m)   Wt 142 lb 3.2 oz (64.5 kg)   SpO2 99%   BMI 22.27 kg/m   BP Readings from Last 3 Encounters:  02/05/21 (!) 160/92  06/19/20 126/86  07/18/19 136/86    Wt Readings from Last 3 Encounters:  02/05/21 142 lb 3.2 oz (64.5 kg)  06/19/20 139 lb (63 kg)  07/18/19 138 lb 8 oz (62.8 kg)    Physical Exam Vitals reviewed.  Constitutional:      Appearance: Normal appearance.  HENT:     Mouth/Throat:     Mouth: Mucous membranes are moist.  Eyes:     Conjunctiva/sclera: Conjunctivae normal.  Cardiovascular:     Rate and Rhythm: Normal rate and regular rhythm.     Heart sounds: No murmur heard. Pulmonary:     Effort: Pulmonary effort is normal.     Breath sounds: No stridor. No wheezing, rhonchi or rales.  Abdominal:     General: Abdomen is flat.     Palpations: There is no mass.     Tenderness: There is no abdominal tenderness.  There is no guarding.  Musculoskeletal:        General: Normal range of motion.     Cervical back: Neck supple.  Lymphadenopathy:     Cervical: No cervical adenopathy.  Skin:    General: Skin is warm and dry.  Neurological:     General: No focal deficit present.     Mental Status: She is alert.  Psychiatric:        Mood and Affect: Mood normal.        Behavior: Behavior normal.    Lab Results  Component Value Date   WBC 6.7 02/05/2021   HGB 11.4 (L) 02/05/2021   HCT 36.2 02/05/2021   PLT 237.0 02/05/2021   GLUCOSE 93 02/05/2021   CHOL 177 06/19/2020   TRIG  138.0 06/19/2020   HDL 73.00 06/19/2020   LDLDIRECT 81.0 07/24/2015   LDLCALC 77 06/19/2020   ALT 15 06/19/2020   AST 20 06/19/2020   NA 138 02/05/2021   K 4.4 02/05/2021   CL 103 02/05/2021   CREATININE 0.73 02/05/2021   BUN 13 02/05/2021   CO2 26 02/05/2021   TSH 2.75 02/05/2021    MM 3D SCREEN BREAST BILATERAL  Result Date: 10/27/2019 CLINICAL DATA:  Screening. EXAM: DIGITAL SCREENING BILATERAL MAMMOGRAM WITH TOMO AND CAD COMPARISON:  Previous exam(s). ACR Breast Density Category b: There are scattered areas of fibroglandular density. FINDINGS: There are no findings suspicious for malignancy. Images were processed with CAD. IMPRESSION: No mammographic evidence of malignancy. A result letter of this screening mammogram will be mailed directly to the patient. RECOMMENDATION: Screening mammogram in one year. (Code:SM-B-01Y) BI-RADS CATEGORY  1: Negative. Electronically Signed   By: Emmaline Kluver M.D.   On: 10/27/2019 09:36    Assessment & Plan:   Stephanie Calderon was seen today for hypertension, anemia and hypothyroidism.  Diagnoses and all orders for this visit:  Deficiency anemia  Anemia due to acquired thiamine deficiency- Her anemia has worsened.  I recommended that she take the thiamine supplement as directed. -     CBC with Differential/Platelet; Future -     thiamine (VITAMIN B-1) 50 MG tablet; Take 1 tablet (50 mg total) by mouth every other day. -     CBC with Differential/Platelet   Essential hypertension- Her blood pressure is not adequately well controlled.  Will restart Dyazide. -     Basic metabolic panel; Future -     TSH; Future -     triamterene-hydrochlorothiazide (DYAZIDE) 37.5-25 MG capsule; Take 1 each (1 capsule total) by mouth daily. -     TSH -     Basic metabolic panel  Subclinical hypothyroidism- Her TSH is normal.  Thyroid replacement therapy is not indicated. -     TSH; Future -     TSH  Screen for colon cancer -     Ambulatory referral to  Gastroenterology  Visit for screening mammogram -     MM DIGITAL SCREENING BILATERAL; Future  I am having Stephanie Calderon maintain her aspirin EC, vitamin B-12, multivitamin, rosuvastatin, ferrous sulfate, triamterene-hydrochlorothiazide, and thiamine.  Meds ordered this encounter  Medications   triamterene-hydrochlorothiazide (DYAZIDE) 37.5-25 MG capsule    Sig: Take 1 each (1 capsule total) by mouth daily.    Dispense:  90 capsule    Refill:  1   thiamine (VITAMIN B-1) 50 MG tablet    Sig: Take 1 tablet (50 mg total) by mouth every other day.    Dispense:  45 tablet  Refill:  1      Follow-up: Return in about 3 months (around 05/07/2021).  Sanda Linger, MD

## 2021-02-05 NOTE — Patient Instructions (Signed)

## 2021-02-06 ENCOUNTER — Encounter: Payer: Self-pay | Admitting: Internal Medicine

## 2021-02-21 ENCOUNTER — Ambulatory Visit: Payer: Self-pay

## 2021-03-22 ENCOUNTER — Ambulatory Visit: Payer: Self-pay

## 2021-04-25 ENCOUNTER — Ambulatory Visit: Payer: Self-pay

## 2021-06-19 ENCOUNTER — Other Ambulatory Visit: Payer: Self-pay | Admitting: Internal Medicine

## 2021-06-19 DIAGNOSIS — D538 Other specified nutritional anemias: Secondary | ICD-10-CM

## 2021-06-19 DIAGNOSIS — E519 Thiamine deficiency, unspecified: Secondary | ICD-10-CM

## 2021-08-02 ENCOUNTER — Other Ambulatory Visit: Payer: Self-pay | Admitting: Internal Medicine

## 2021-08-02 DIAGNOSIS — I10 Essential (primary) hypertension: Secondary | ICD-10-CM

## 2021-08-09 ENCOUNTER — Other Ambulatory Visit: Payer: Self-pay | Admitting: Internal Medicine

## 2021-08-09 DIAGNOSIS — I10 Essential (primary) hypertension: Secondary | ICD-10-CM

## 2021-08-12 ENCOUNTER — Encounter: Payer: Self-pay | Admitting: Internal Medicine

## 2021-08-12 ENCOUNTER — Ambulatory Visit (INDEPENDENT_AMBULATORY_CARE_PROVIDER_SITE_OTHER): Payer: Medicare PPO | Admitting: Internal Medicine

## 2021-08-12 VITALS — BP 144/88 | HR 72 | Temp 98.3°F | Resp 16 | Ht 67.0 in | Wt 143.0 lb

## 2021-08-12 DIAGNOSIS — I10 Essential (primary) hypertension: Secondary | ICD-10-CM | POA: Diagnosis not present

## 2021-08-12 DIAGNOSIS — Z1211 Encounter for screening for malignant neoplasm of colon: Secondary | ICD-10-CM

## 2021-08-12 DIAGNOSIS — E785 Hyperlipidemia, unspecified: Secondary | ICD-10-CM

## 2021-08-12 DIAGNOSIS — E038 Other specified hypothyroidism: Secondary | ICD-10-CM | POA: Diagnosis not present

## 2021-08-12 DIAGNOSIS — Z0000 Encounter for general adult medical examination without abnormal findings: Secondary | ICD-10-CM

## 2021-08-12 DIAGNOSIS — D538 Other specified nutritional anemias: Secondary | ICD-10-CM | POA: Diagnosis not present

## 2021-08-12 DIAGNOSIS — Z23 Encounter for immunization: Secondary | ICD-10-CM

## 2021-08-12 DIAGNOSIS — E519 Thiamine deficiency, unspecified: Secondary | ICD-10-CM | POA: Diagnosis not present

## 2021-08-12 LAB — LIPID PANEL
Cholesterol: 134 mg/dL (ref 0–200)
HDL: 67.1 mg/dL (ref >39.00)
LDL Cholesterol: 42 mg/dL (ref 0–99)
NonHDL: 66.69
Total CHOL/HDL Ratio: 2
Triglycerides: 122 mg/dL (ref 0.0–149.0)
VLDL: 24.4 mg/dL (ref 0.0–40.0)

## 2021-08-12 LAB — BASIC METABOLIC PANEL
BUN: 18 mg/dL (ref 6–23)
CO2: 28 mEq/L (ref 19–32)
Calcium: 9.9 mg/dL (ref 8.4–10.5)
Chloride: 106 mEq/L (ref 96–112)
Creatinine, Ser: 0.79 mg/dL (ref 0.40–1.20)
GFR: 76.23 mL/min (ref >60.00)
Glucose, Bld: 92 mg/dL (ref 70–99)
Potassium: 3.9 mEq/L (ref 3.5–5.1)
Sodium: 141 mEq/L (ref 135–145)

## 2021-08-12 LAB — CBC WITH DIFFERENTIAL/PLATELET
Basophils Absolute: 0 10*3/uL (ref 0.0–0.1)
Basophils Relative: 0.3 % (ref 0.0–3.0)
Eosinophils Absolute: 0.2 10*3/uL (ref 0.0–0.7)
Eosinophils Relative: 4.3 % (ref 0.0–5.0)
HCT: 35.1 % — ABNORMAL LOW (ref 36.0–46.0)
Hemoglobin: 11.3 g/dL — ABNORMAL LOW (ref 12.0–15.0)
Lymphocytes Relative: 29.6 % (ref 12.0–46.0)
Lymphs Abs: 1.6 10*3/uL (ref 0.7–4.0)
MCHC: 32.1 g/dL (ref 30.0–36.0)
MCV: 76.3 fl — ABNORMAL LOW (ref 78.0–100.0)
Monocytes Absolute: 0.5 10*3/uL (ref 0.1–1.0)
Monocytes Relative: 9.1 % (ref 3.0–12.0)
Neutro Abs: 3.1 10*3/uL (ref 1.4–7.7)
Neutrophils Relative %: 56.7 % (ref 43.0–77.0)
Platelets: 232 10*3/uL (ref 150.0–400.0)
RBC: 4.6 Mil/uL (ref 3.87–5.11)
RDW: 15.6 % — ABNORMAL HIGH (ref 11.5–15.5)
WBC: 5.5 10*3/uL (ref 4.0–10.5)

## 2021-08-12 LAB — HEPATIC FUNCTION PANEL
ALT: 19 U/L (ref 0–35)
AST: 19 U/L (ref 0–37)
Albumin: 4.3 g/dL (ref 3.5–5.2)
Alkaline Phosphatase: 54 U/L (ref 39–117)
Bilirubin, Direct: 0 mg/dL (ref 0.0–0.3)
Total Bilirubin: 0.2 mg/dL (ref 0.2–1.2)
Total Protein: 7 g/dL (ref 6.0–8.3)

## 2021-08-12 LAB — TSH: TSH: 3.31 u[IU]/mL (ref 0.35–5.50)

## 2021-08-12 MED ORDER — VITAMIN B-1 50 MG PO TABS: 50.0000 mg | ORAL_TABLET | ORAL | 1 refills | Status: DC

## 2021-08-12 MED ORDER — TRIAMTERENE-HCTZ 37.5-25 MG PO CAPS: 1.0000 | ORAL_CAPSULE | Freq: Every day | ORAL | 1 refills | Status: DC

## 2021-08-12 NOTE — Progress Notes (Signed)
? ?Subjective:  ?Patient ID: Stephanie Calderon, female    DOB: 07-23-1950  Age: 71 y.o. MRN: [id number] ? ?CC: Annual Exam, Hypertension, and Hyperlipidemia ? ?HPI ?Stephanie Calderon presents for a CPX and f/up - ? ?She has not recently been taking her antihypertensive or B1 supplement.  She is very active and denies chest pain, shortness of breath, diaphoresis, dizziness, lightheadedness, or edema. ? ?Outpatient Medications Prior to Visit  ?Medication Sig Dispense Refill  ? aspirin EC 81 MG tablet Take 81 mg by mouth daily.    ? ferrous sulfate 325 (65 FE) MG tablet Take 325 mg by mouth daily with breakfast.    ? Multiple Vitamin (MULTIVITAMIN) tablet Take 1 tablet by mouth daily.    ? rosuvastatin (CRESTOR) 5 MG tablet Take 1 tablet (5 mg total) by mouth daily. 90 tablet 1  ? vitamin B-12 (CYANOCOBALAMIN) 1000 MCG tablet Take 1,000 mcg by mouth daily.    ? thiamine (VITAMIN B-1) 50 MG tablet TAKE 1 TABLET (50 MG TOTAL) BY MOUTH EVERY OTHER DAY. 45 tablet 1  ? triamterene-hydrochlorothiazide (DYAZIDE) 37.5-25 MG capsule Take 1 each (1 capsule total) by mouth daily. 90 capsule 1  ? ?No facility-administered medications prior to visit.  ? ? ?ROS ?Review of Systems  ?Constitutional: Negative.  Negative for chills, diaphoresis, fatigue and fever.  ?HENT: Negative.    ?Eyes: Negative.   ?Respiratory:  Negative for cough, chest tightness, shortness of breath and wheezing.   ?Cardiovascular:  Negative for chest pain, palpitations and leg swelling.  ?Gastrointestinal:  Negative for abdominal pain, blood in stool, constipation, diarrhea and vomiting.  ?Endocrine: Negative.   ?Genitourinary: Negative.  Negative for difficulty urinating.  ?Musculoskeletal: Negative.  Negative for arthralgias and myalgias.  ?Skin: Negative.   ?Neurological:  Negative for dizziness, weakness, light-headedness and headaches.  ?Hematological:  Negative for adenopathy. Does not bruise/bleed easily.  ?Psychiatric/Behavioral: Negative.    ? ?Objective:  ?BP  (!) 144/88 (BP Location: Left Arm, Patient Position: Sitting, Cuff Size: Large)   Pulse 72   Temp 98.3 ?F (36.8 ?C) (Oral)   Resp 16   Ht 5\' 7"  (1.702 m)   Wt 143 lb (64.9 kg)   SpO2 98%   BMI 22.40 kg/m?  ? ?BP Readings from Last 3 Encounters:  ?08/12/21 (!) 144/88  ?02/05/21 (!) 160/92  ?06/19/20 126/86  ? ? ?Wt Readings from Last 3 Encounters:  ?08/12/21 143 lb (64.9 kg)  ?02/05/21 142 lb 3.2 oz (64.5 kg)  ?06/19/20 139 lb (63 kg)  ? ? ?Physical Exam ?Vitals reviewed.  ?Constitutional:   ?   Appearance: Normal appearance.  ?HENT:  ?   Nose: Nose normal.  ?   Mouth/Throat:  ?   Mouth: Mucous membranes are moist.  ?Eyes:  ?   General: No scleral icterus. ?   Conjunctiva/sclera: Conjunctivae normal.  ?Cardiovascular:  ?   Rate and Rhythm: Normal rate and regular rhythm.  ?   Heart sounds: No murmur heard. ?Pulmonary:  ?   Effort: Pulmonary effort is normal.  ?   Breath sounds: No stridor. No wheezing, rhonchi or rales.  ?Abdominal:  ?   General: Abdomen is flat.  ?   Palpations: There is no mass.  ?   Tenderness: There is no abdominal tenderness. There is no guarding.  ?   Hernia: No hernia is present.  ?Musculoskeletal:     ?   General: Normal range of motion.  ?   Cervical back: Neck supple.  ?  Right lower leg: No edema.  ?   Left lower leg: No edema.  ?Lymphadenopathy:  ?   Cervical: No cervical adenopathy.  ?Skin: ?   General: Skin is warm and dry.  ?Neurological:  ?   General: No focal deficit present.  ?   Mental Status: She is alert.  ?Psychiatric:     ?   Mood and Affect: Mood normal.     ?   Behavior: Behavior normal.  ? ? ?Lab Results  ?Component Value Date  ? WBC 5.5 08/12/2021  ? HGB 11.3 (L) 08/12/2021  ? HCT 35.1 (L) 08/12/2021  ? PLT 232.0 08/12/2021  ? GLUCOSE 92 08/12/2021  ? CHOL 134 08/12/2021  ? TRIG 122.0 08/12/2021  ? HDL 67.10 08/12/2021  ? LDLDIRECT 81.0 07/24/2015  ? LDLCALC 42 08/12/2021  ? ALT 19 08/12/2021  ? AST 19 08/12/2021  ? NA 141 08/12/2021  ? K 3.9 08/12/2021  ? CL 106  08/12/2021  ? CREATININE 0.79 08/12/2021  ? BUN 18 08/12/2021  ? CO2 28 08/12/2021  ? TSH 3.31 08/12/2021  ? ? ?MM 3D SCREEN BREAST BILATERAL ? ?Result Date: 10/27/2019 ?CLINICAL DATA:  Screening. EXAM: DIGITAL SCREENING BILATERAL MAMMOGRAM WITH TOMO AND CAD COMPARISON:  Previous exam(s). ACR Breast Density Category b: There are scattered areas of fibroglandular density. FINDINGS: There are no findings suspicious for malignancy. Images were processed with CAD. IMPRESSION: No mammographic evidence of malignancy. A result letter of this screening mammogram will be mailed directly to the patient. RECOMMENDATION: Screening mammogram in one year. (Code:SM-B-01Y) BI-RADS CATEGORY  1: Negative. Electronically Signed   By: Emmaline Kluver M.D.   On: 10/27/2019 09:36  ? ? ?Assessment & Plan:  ? ?Stephanie Calderon was seen today for annual exam, hypertension and hyperlipidemia. ? ?Diagnoses and all orders for this visit: ? ?Essential hypertension- She has not achieved her blood pressure goal of 130/80.  Will restart the diuretic. ?-     Basic metabolic panel; Future ?-     CBC with Differential/Platelet; Future ?-     Hepatic function panel; Future ?-     triamterene-hydrochlorothiazide (DYAZIDE) 37.5-25 MG capsule; Take 1 each (1 capsule total) by mouth daily. ?-     Hepatic function panel ?-     CBC with Differential/Platelet ?-     Basic metabolic panel ? ?Hyperlipidemia LDL goal <130- LDL goal achieved. Doing well on the statin  ?-     Lipid panel; Future ?-     Hepatic function panel; Future ?-     Hepatic function panel ?-     Lipid panel ? ?Anemia due to acquired thiamine deficiency- Her anemia has worsened.  Will restart the thiamine supplement and I have asked her to return for recheck in 3 months. ?-     CBC with Differential/Platelet; Future ?-     thiamine (VITAMIN B-1) 50 MG tablet; Take 1 tablet (50 mg total) by mouth every other day. ?-     CBC with Differential/Platelet ? ?Routine general medical examination at a health  care facility- Exam completed, labs reviewed, vaccines reviewed and updated, cancer screenings addressed, patient education was given. ? ?Subclinical hypothyroidism- She is euthyroid. ?-     TSH; Future ?-     TSH ? ?Screen for colon cancer ?-     Cologuard ? ? ?I am having Stephanie Calderon start on Shingrix. I am also having her maintain her aspirin EC, vitamin B-12, multivitamin, rosuvastatin, ferrous sulfate, thiamine, and triamterene-hydrochlorothiazide. ? ?Meds  ordered this encounter  ?Medications  ? thiamine (VITAMIN B-1) 50 MG tablet  ?  Sig: Take 1 tablet (50 mg total) by mouth every other day.  ?  Dispense:  45 tablet  ?  Refill:  1  ? triamterene-hydrochlorothiazide (DYAZIDE) 37.5-25 MG capsule  ?  Sig: Take 1 each (1 capsule total) by mouth daily.  ?  Dispense:  90 capsule  ?  Refill:  1  ? Zoster Vaccine Adjuvanted [facility](SHINGRIX) injection  ?  Sig: Inject 0.5 mLs into the muscle once for 1 dose.  ?  Dispense:  0.5 mL  ?  Refill:  1  ? ? ? ?Follow-up: Return in about 6 months (around 02/11/2022). ? ?Sanda Lingerhomas Alasdair Kleve, MD ?

## 2021-08-12 NOTE — Patient Instructions (Signed)

## 2021-08-13 ENCOUNTER — Encounter: Payer: Self-pay | Admitting: Internal Medicine

## 2021-08-17 MED ORDER — SHINGRIX 50 MCG/0.5ML IM SUSR: 0.5000 mL | Freq: Once | INTRAMUSCULAR | 1 refills | Status: AC

## 2022-02-04 IMAGING — MG DIGITAL SCREENING BILAT W/ TOMO W/ CAD
8 series · 8 of 24 positions shown · non-contrast
Comparison: Previous exam(s).

CLINICAL DATA: Screening.

EXAM:
DIGITAL SCREENING BILATERAL MAMMOGRAM WITH TOMO AND CAD

[L MLO synth-2D]
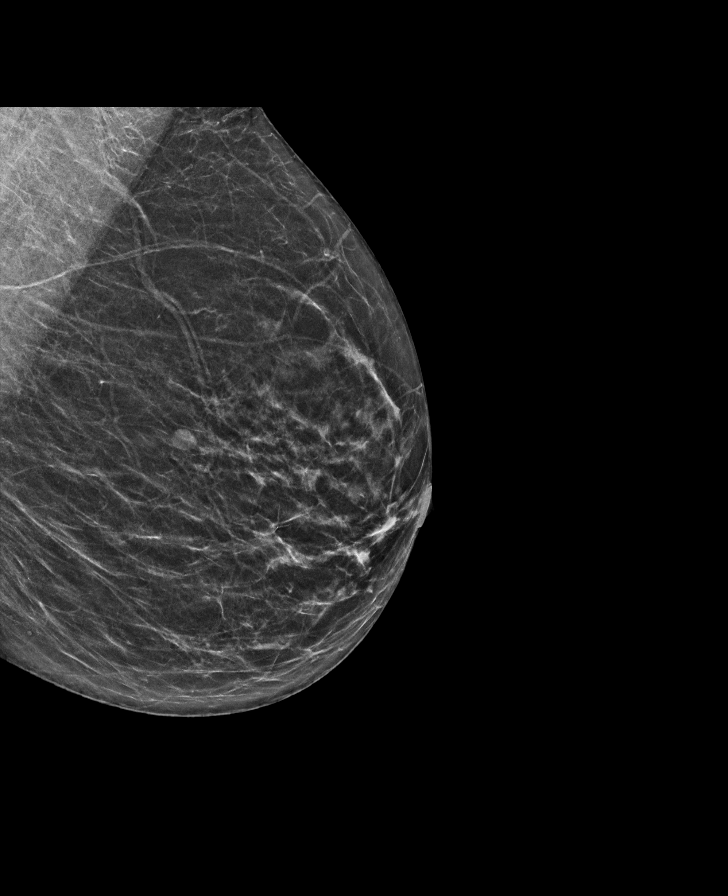

[R CC synth-2D]
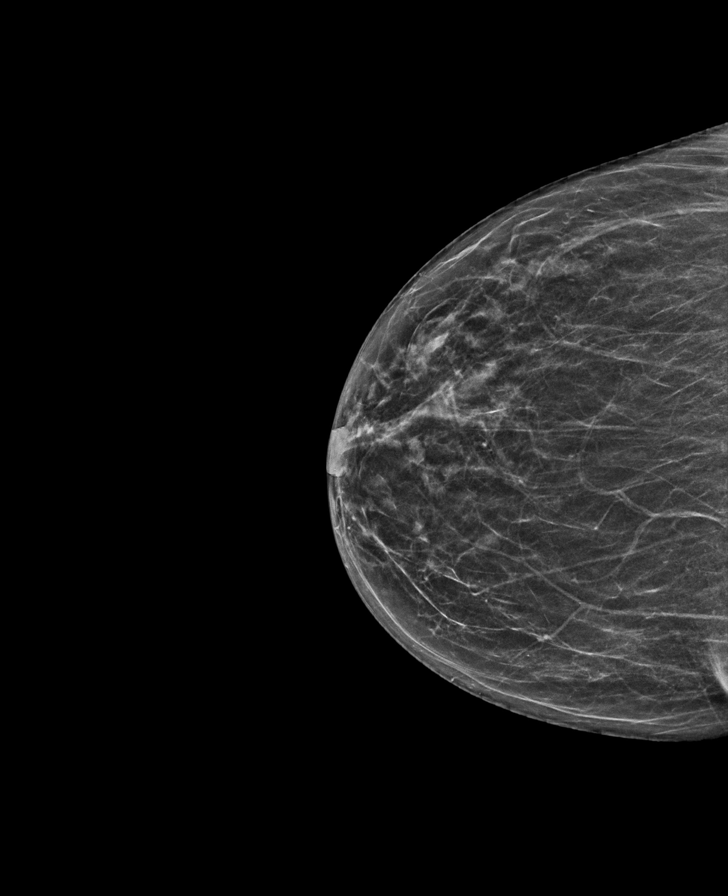

[R MLO synth-2D]
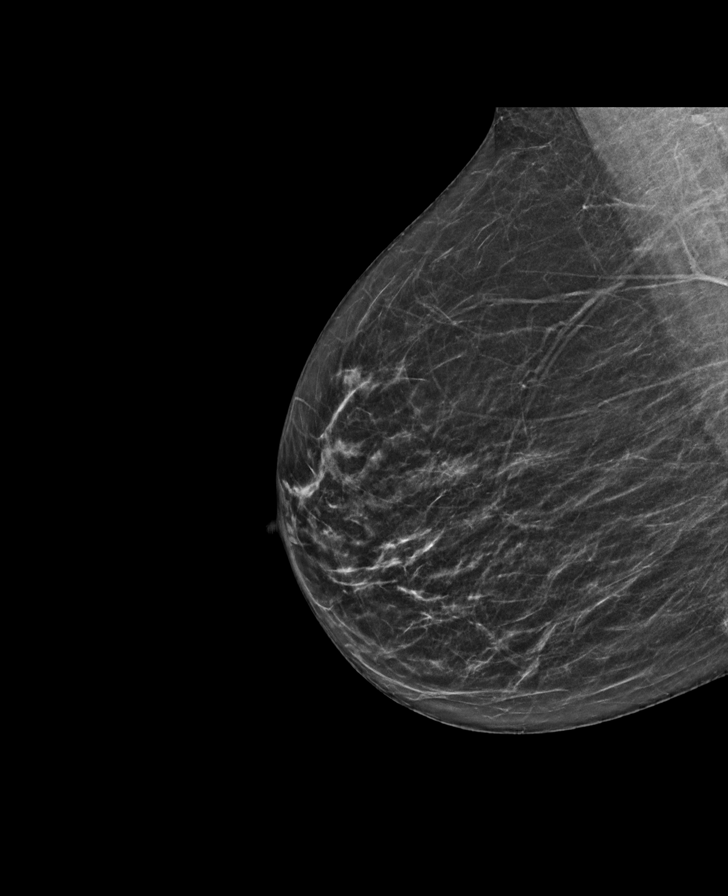

[L CC synth-2D]
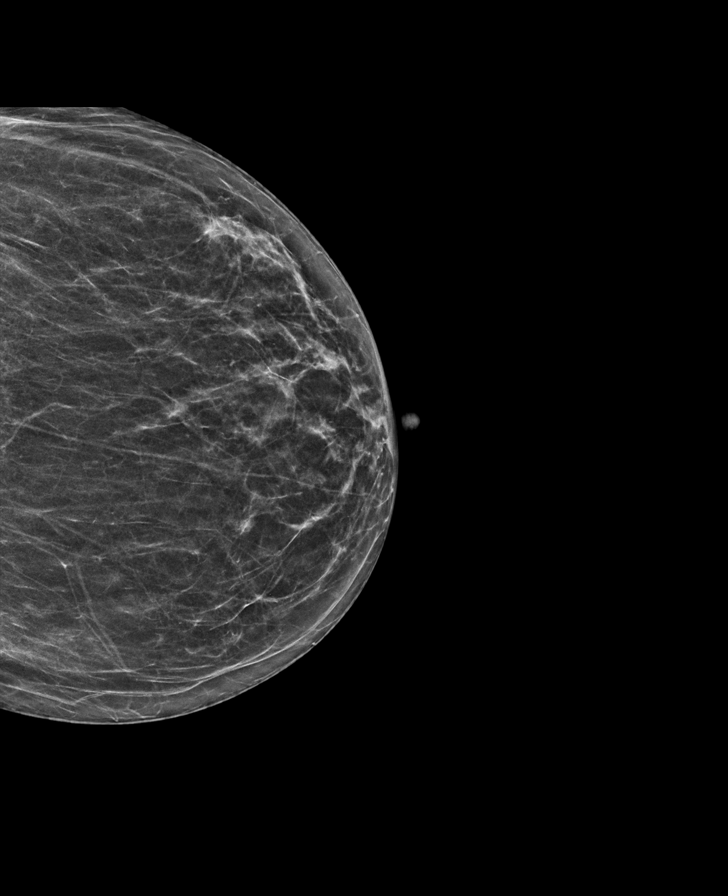

[R CC tomo · tomo slice 27/53.0]
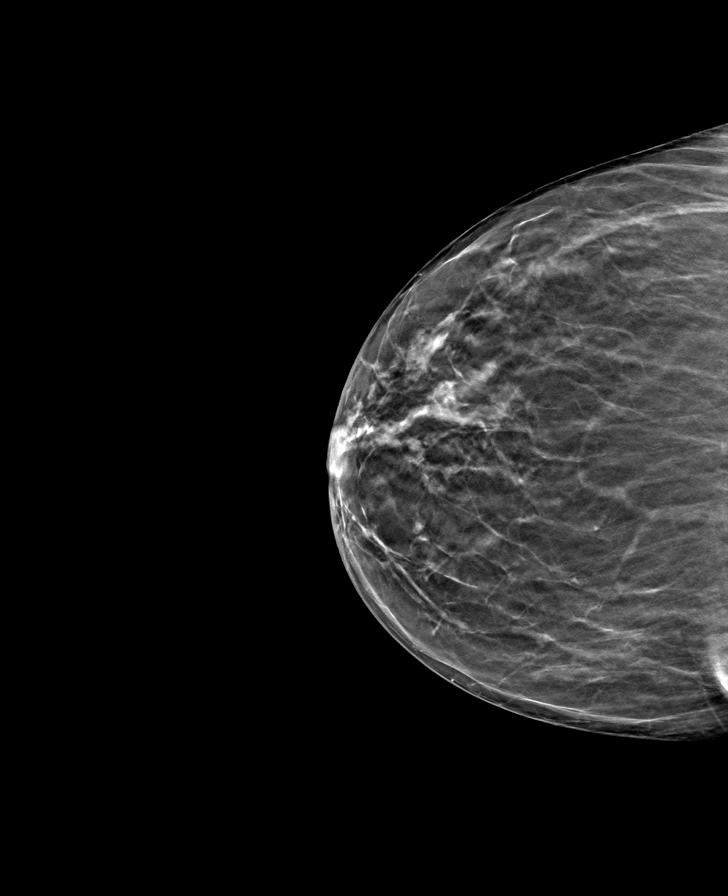

[R MLO tomo · tomo slice 28/55.0]
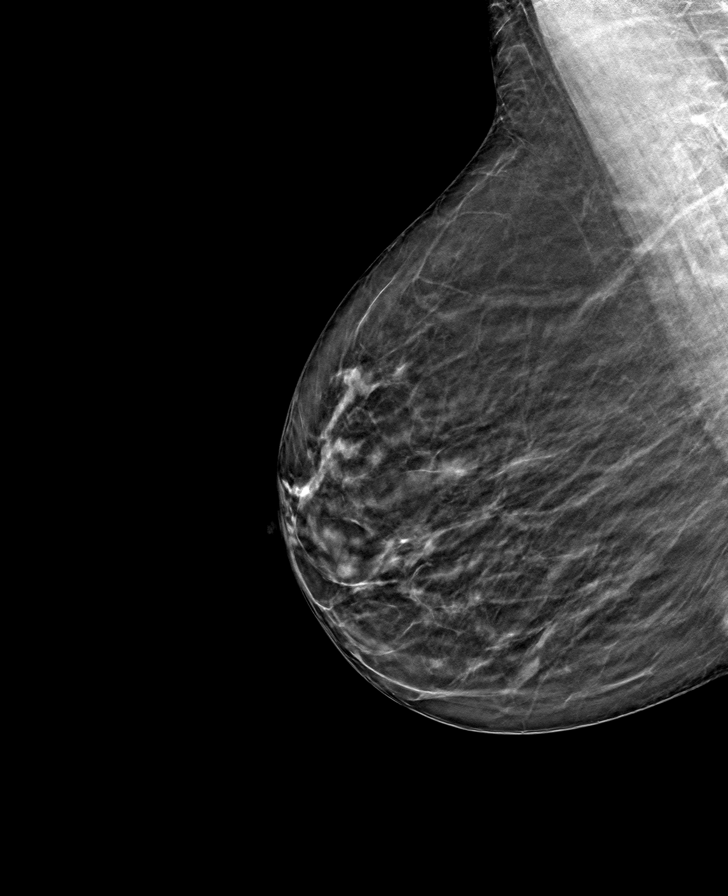

[L CC tomo · tomo slice 27/53.0]
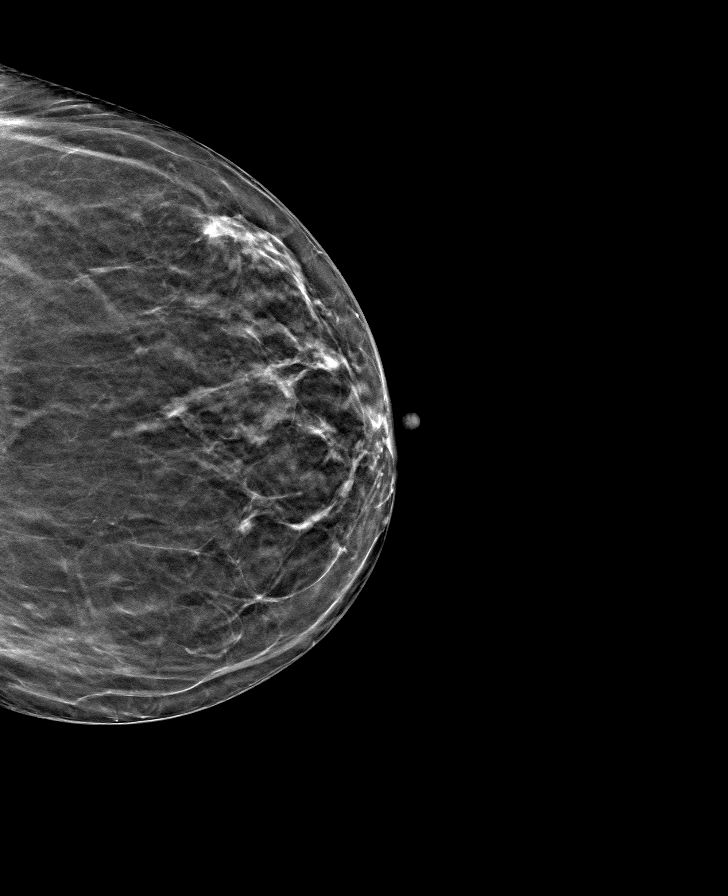

[L MLO tomo · tomo slice 29/57.0]
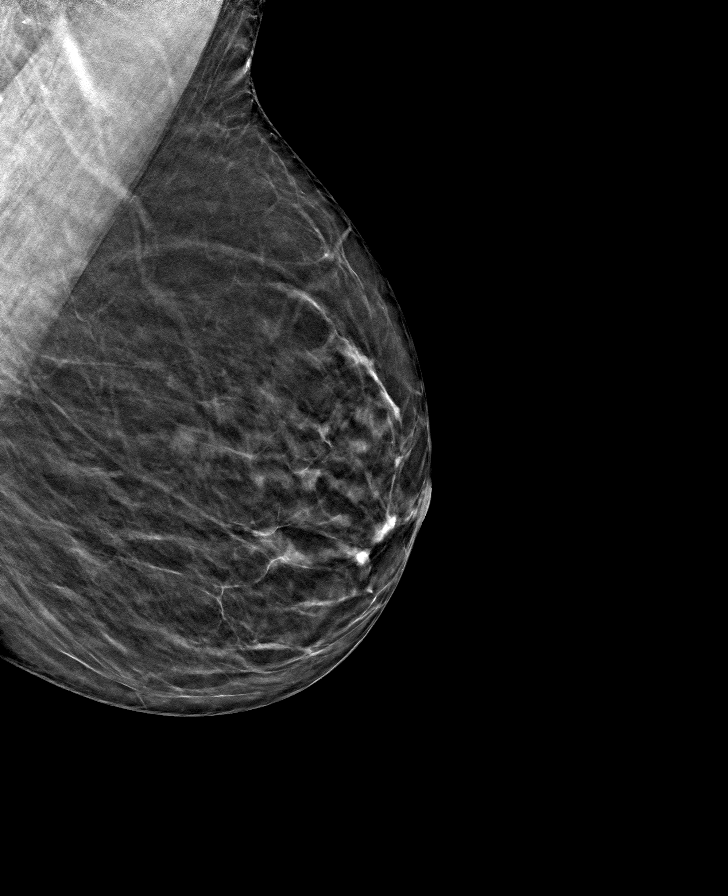

[8 of 24 positions shown; findings below may reference images not displayed]

ACR Breast Density Category b: There are scattered areas of
fibroglandular density.
FINDINGS: There are no findings suspicious for malignancy. Images were
processed with CAD.
IMPRESSION: No mammographic evidence of malignancy. A result letter of this
screening mammogram will be mailed directly to the patient.

RECOMMENDATION:
Screening mammogram in one year. (Code:CN-U-775)

BI-RADS CATEGORY  1: Negative.

## 2022-02-19 ENCOUNTER — Other Ambulatory Visit: Payer: Self-pay | Admitting: Internal Medicine

## 2022-02-19 DIAGNOSIS — I10 Essential (primary) hypertension: Secondary | ICD-10-CM

## 2022-02-27 ENCOUNTER — Ambulatory Visit (INDEPENDENT_AMBULATORY_CARE_PROVIDER_SITE_OTHER): Payer: Medicare PPO | Admitting: Internal Medicine

## 2022-02-27 ENCOUNTER — Encounter: Payer: Self-pay | Admitting: Internal Medicine

## 2022-02-27 VITALS — BP 126/80 | HR 70 | Temp 98.1°F | Ht 67.0 in | Wt 150.0 lb

## 2022-02-27 DIAGNOSIS — D538 Other specified nutritional anemias: Secondary | ICD-10-CM

## 2022-02-27 DIAGNOSIS — Z1211 Encounter for screening for malignant neoplasm of colon: Secondary | ICD-10-CM

## 2022-02-27 DIAGNOSIS — E785 Hyperlipidemia, unspecified: Secondary | ICD-10-CM | POA: Diagnosis not present

## 2022-02-27 DIAGNOSIS — E519 Thiamine deficiency, unspecified: Secondary | ICD-10-CM

## 2022-02-27 DIAGNOSIS — I10 Essential (primary) hypertension: Secondary | ICD-10-CM | POA: Diagnosis not present

## 2022-02-27 DIAGNOSIS — Z1231 Encounter for screening mammogram for malignant neoplasm of breast: Secondary | ICD-10-CM

## 2022-02-27 LAB — BASIC METABOLIC PANEL
BUN: 16 mg/dL (ref 6–23)
CO2: 28 mEq/L (ref 19–32)
Calcium: 9.5 mg/dL (ref 8.4–10.5)
Chloride: 105 mEq/L (ref 96–112)
Creatinine, Ser: 0.89 mg/dL (ref 0.40–1.20)
GFR: 65.82 mL/min (ref >60.00)
Glucose, Bld: 92 mg/dL (ref 70–99)
Potassium: 4.4 mEq/L (ref 3.5–5.1)
Sodium: 139 mEq/L (ref 135–145)

## 2022-02-27 LAB — CBC WITH DIFFERENTIAL/PLATELET
Basophils Absolute: 0 10*3/uL (ref 0.0–0.1)
Basophils Relative: 0.7 % (ref 0.0–3.0)
Eosinophils Absolute: 0.4 10*3/uL (ref 0.0–0.7)
Eosinophils Relative: 5.1 % — ABNORMAL HIGH (ref 0.0–5.0)
HCT: 37.1 % (ref 36.0–46.0)
Hemoglobin: 11.7 g/dL — ABNORMAL LOW (ref 12.0–15.0)
Lymphocytes Relative: 30.6 % (ref 12.0–46.0)
Lymphs Abs: 2.2 10*3/uL (ref 0.7–4.0)
MCHC: 31.5 g/dL (ref 30.0–36.0)
MCV: 76.8 fl — ABNORMAL LOW (ref 78.0–100.0)
Monocytes Absolute: 0.5 10*3/uL (ref 0.1–1.0)
Monocytes Relative: 7.6 % (ref 3.0–12.0)
Neutro Abs: 4 10*3/uL (ref 1.4–7.7)
Neutrophils Relative %: 56 % (ref 43.0–77.0)
Platelets: 271 10*3/uL (ref 150.0–400.0)
RBC: 4.83 Mil/uL (ref 3.87–5.11)
RDW: 15.9 % — ABNORMAL HIGH (ref 11.5–15.5)
WBC: 7.1 10*3/uL (ref 4.0–10.5)

## 2022-02-27 MED ORDER — ROSUVASTATIN CALCIUM 5 MG PO TABS: 5.0000 mg | ORAL_TABLET | Freq: Every day | ORAL | 1 refills | Status: DC

## 2022-02-27 MED ORDER — VITAMIN B-1 50 MG PO TABS: 50.0000 mg | ORAL_TABLET | ORAL | 1 refills | Status: DC

## 2022-02-27 MED ORDER — SHINGRIX 50 MCG/0.5ML IM SUSR: 0.5000 mL | Freq: Once | INTRAMUSCULAR | 1 refills | Status: AC

## 2022-02-27 NOTE — Progress Notes (Signed)
Subjective:  Patient ID: Stephanie Calderon, female    DOB: 27-Oct-1950  Age: 71 y.o. MRN: [id number]  CC: Anemia and Hypertension   HPI Stephanie Calderon presents for f/up -  She walks a few miles every other day and has good endurance - She denies DOE, CP, SOB, edema.  Outpatient Medications Prior to Visit  Medication Sig Dispense Refill   aspirin EC 81 MG tablet Take 81 mg by mouth daily.     ferrous sulfate 325 (65 FE) MG tablet Take 325 mg by mouth daily with breakfast.     Multiple Vitamin (MULTIVITAMIN) tablet Take 1 tablet by mouth daily.     triamterene-hydrochlorothiazide (DYAZIDE) 37.5-25 MG capsule Take 1 each (1 capsule total) by mouth daily. 90 capsule 1   vitamin B-12 (CYANOCOBALAMIN) 1000 MCG tablet Take 1,000 mcg by mouth daily.     rosuvastatin (CRESTOR) 5 MG tablet Take 1 tablet (5 mg total) by mouth daily. 90 tablet 1   thiamine (VITAMIN B-1) 50 MG tablet Take 1 tablet (50 mg total) by mouth every other day. 45 tablet 1   No facility-administered medications prior to visit.    ROS Review of Systems  Constitutional:  Negative for diaphoresis and fatigue.  HENT: Negative.    Eyes: Negative.   Respiratory:  Negative for cough, chest tightness, shortness of breath and wheezing.   Cardiovascular:  Negative for chest pain, palpitations and leg swelling.  Gastrointestinal:  Negative for abdominal pain, constipation, diarrhea, nausea and vomiting.  Endocrine: Negative.   Genitourinary: Negative.  Negative for difficulty urinating.  Musculoskeletal: Negative.  Negative for myalgias.  Skin: Negative.   Neurological:  Negative for dizziness and weakness.  Hematological:  Negative for adenopathy. Does not bruise/bleed easily.  Psychiatric/Behavioral: Negative.      Objective:  BP 126/80 (BP Location: Left Arm, Patient Position: Sitting, Cuff Size: Large)   Pulse 70   Temp 98.1 F (36.7 C) (Oral)   Ht 5\' 7"  (1.702 m)   Wt 150 lb (68 kg)   SpO2 99%   BMI 23.49 kg/m    BP Readings from Last 3 Encounters:  02/27/22 126/80  08/12/21 (!) 144/88  02/05/21 (!) 160/92    Wt Readings from Last 3 Encounters:  02/27/22 150 lb (68 kg)  08/12/21 143 lb (64.9 kg)  02/05/21 142 lb 3.2 oz (64.5 kg)    Physical Exam Vitals reviewed.  HENT:     Nose: Nose normal.     Mouth/Throat:     Mouth: Mucous membranes are moist.  Eyes:     General: No scleral icterus.    Conjunctiva/sclera: Conjunctivae normal.  Cardiovascular:     Rate and Rhythm: Normal rate and regular rhythm.     Heart sounds: No murmur heard. Pulmonary:     Effort: Pulmonary effort is normal.     Breath sounds: No stridor. No wheezing, rhonchi or rales.  Abdominal:     General: Abdomen is flat.     Palpations: There is no mass.     Tenderness: There is no abdominal tenderness. There is no guarding.     Hernia: No hernia is present.  Musculoskeletal:        General: Normal range of motion.     Cervical back: Neck supple.     Left lower leg: No edema.  Lymphadenopathy:     Cervical: No cervical adenopathy.  Skin:    General: Skin is warm and dry.  Neurological:     General: No focal deficit  present.     Mental Status: She is alert.  Psychiatric:        Mood and Affect: Mood normal.        Behavior: Behavior normal.     Lab Results  Component Value Date   WBC 7.1 02/27/2022   HGB 11.7 (L) 02/27/2022   HCT 37.1 02/27/2022   PLT 271.0 02/27/2022   GLUCOSE 92 02/27/2022   CHOL 134 08/12/2021   TRIG 122.0 08/12/2021   HDL 67.10 08/12/2021   LDLDIRECT 81.0 07/24/2015   LDLCALC 42 08/12/2021   ALT 19 08/12/2021   AST 19 08/12/2021   NA 139 02/27/2022   K 4.4 02/27/2022   CL 105 02/27/2022   CREATININE 0.89 02/27/2022   BUN 16 02/27/2022   CO2 28 02/27/2022   TSH 3.31 08/12/2021    MM 3D SCREEN BREAST BILATERAL  Result Date: 10/27/2019 CLINICAL DATA:  Screening. EXAM: DIGITAL SCREENING BILATERAL MAMMOGRAM WITH TOMO AND CAD COMPARISON:  Previous exam(s). ACR Breast  Density Category b: There are scattered areas of fibroglandular density. FINDINGS: There are no findings suspicious for malignancy. Images were processed with CAD. IMPRESSION: No mammographic evidence of malignancy. A result letter of this screening mammogram will be mailed directly to the patient. RECOMMENDATION: Screening mammogram in one year. (Code:SM-B-01Y) BI-RADS CATEGORY  1: Negative. Electronically Signed   By: Audie Pinto M.D.   On: 10/27/2019 09:36    Assessment & Plan:   Stephanie Calderon was seen today for anemia and hypertension.  Diagnoses and all orders for this visit:  Anemia due to acquired thiamine deficiency -     CBC with Differential/Platelet; Future -     thiamine (VITAMIN B-1) 50 MG tablet; Take 1 tablet (50 mg total) by mouth every other day. -     CBC with Differential/Platelet  Hyperlipidemia LDL goal <130- LDL goal achieved. Doing well on the statin  -     rosuvastatin (CRESTOR) 5 MG tablet; Take 1 tablet (5 mg total) by mouth daily.  Essential hypertension- Her BP is well controlled. -     Basic metabolic panel; Future -     Basic metabolic panel  Screen for colon cancer -     Ambulatory referral to Gastroenterology  Visit for screening mammogram -     MM DIGITAL SCREENING BILATERAL; Future  Other orders -     Zoster Vaccine Adjuvanted [facility]) injection; Inject 0.5 mLs into the muscle once for 1 dose.   I am having Stephanie Calderon start on Shingrix. I am also having her maintain her aspirin EC, cyanocobalamin, multivitamin, ferrous sulfate, triamterene-hydrochlorothiazide, thiamine, and rosuvastatin.  Meds ordered this encounter  Medications   thiamine (VITAMIN B-1) 50 MG tablet    Sig: Take 1 tablet (50 mg total) by mouth every other day.    Dispense:  45 tablet    Refill:  1   rosuvastatin (CRESTOR) 5 MG tablet    Sig: Take 1 tablet (5 mg total) by mouth daily.    Dispense:  90 tablet    Refill:  1   Zoster Vaccine Adjuvanted [facility]) injection     Sig: Inject 0.5 mLs into the muscle once for 1 dose.    Dispense:  0.5 mL    Refill:  1     Follow-up: No follow-ups on file.  Stephanie Calico, MD

## 2022-02-28 ENCOUNTER — Encounter: Payer: Self-pay | Admitting: Internal Medicine

## 2022-03-03 ENCOUNTER — Telehealth: Payer: Self-pay | Admitting: Internal Medicine

## 2022-03-03 ENCOUNTER — Other Ambulatory Visit: Payer: Self-pay | Admitting: *Deleted

## 2022-03-03 DIAGNOSIS — I10 Essential (primary) hypertension: Secondary | ICD-10-CM

## 2022-03-03 MED ORDER — TRIAMTERENE-HCTZ 37.5-25 MG PO CAPS: 1.0000 | ORAL_CAPSULE | Freq: Every day | ORAL | 1 refills | Status: DC

## 2022-03-03 NOTE — Telephone Encounter (Signed)
Sent!

## 2022-03-03 NOTE — Telephone Encounter (Signed)
Patient needs her blood pressure medication (Dyazide 37.5-25 mg) - generic - Please send to [facility] on [address], [place], [place]  Last visit:  02/27/2022 - next visit not scheduled yet.

## 2022-04-29 ENCOUNTER — Ambulatory Visit: Payer: Medicare PPO

## 2022-06-26 ENCOUNTER — Ambulatory Visit
Admission: RE | Admit: 2022-06-26 | Discharge: 2022-06-26 | Disposition: A | Discharge status: Home / Self Care | Payer: Medicare PPO | Source: Ambulatory Visit | Attending: Internal Medicine | Admitting: Internal Medicine

## 2022-06-26 DIAGNOSIS — Z1231 Encounter for screening mammogram for malignant neoplasm of breast: Secondary | ICD-10-CM

## 2022-07-09 ENCOUNTER — Other Ambulatory Visit: Payer: Self-pay

## 2022-07-09 ENCOUNTER — Ambulatory Visit (INDEPENDENT_AMBULATORY_CARE_PROVIDER_SITE_OTHER): Payer: Medicare PPO

## 2022-07-09 VITALS — Wt 150.0 lb

## 2022-07-09 DIAGNOSIS — Z780 Asymptomatic menopausal state: Secondary | ICD-10-CM

## 2022-07-09 DIAGNOSIS — Z0000 Encounter for general adult medical examination without abnormal findings: Secondary | ICD-10-CM

## 2022-07-09 MED ORDER — VITAMIN B-1 100 MG PO TABS: 100.0000 mg | ORAL_TABLET | Freq: Every day | ORAL | 1 refills | Status: DC

## 2022-07-09 NOTE — Patient Instructions (Signed)
Stephanie Calderon , Thank you for taking time to come for your Medicare Wellness Visit. I appreciate your ongoing commitment to your health goals. Please review the following plan we discussed and let me know if I can assist you in the future.   These are the goals we discussed:  Goals      Patient Stated     Continue to walk and do my exercise routine, eat healthy and stay socially active.          This is a list of the screening recommended for you and due dates:  Health Maintenance  Topic Date Due   DEXA scan (bone density measurement)  Never done   COVID-19 Vaccine (1) 07/25/2022*   Flu Shot  08/10/2022*   Zoster (Shingles) Vaccine (1 of 2) 10/07/2022*   Cologuard (Stool DNA test)  07/10/2023*   DTaP/Tdap/Td vaccine (2 - Td or Tdap) 11/30/2022   Medicare Annual Wellness Visit  07/10/2023   Mammogram  06/26/2024   Pneumonia Vaccine  Completed   Hepatitis C Screening: USPSTF Recommendation to screen - Ages 18-79 yo.  Completed   HPV Vaccine  Aged Out  *Topic was postponed. The date shown is not the original due date.    Advanced directives: Advance directive discussed with you today. Even though you declined this today, please call our office should you change your mind, and we can give you the proper paperwork for you to fill out.   Conditions/risks identified: Aim for 30 minutes of exercise or brisk walking, 6-8 glasses of water, and 5 servings of fruits and vegetables each day.   Next appointment: Follow up in one year for your annual wellness visit    Preventive Care 65 Years and Older, Female Preventive care refers to lifestyle choices and visits with your health care provider that can promote health and wellness. What does preventive care include? A yearly physical exam. This is also called an annual well check. Dental exams once or twice a year. Routine eye exams. Ask your health care provider how often you should have your eyes checked. Personal lifestyle choices,  including: Daily care of your teeth and gums. Regular physical activity. Eating a healthy diet. Avoiding tobacco and drug use. Limiting alcohol use. Practicing safe sex. Taking low-dose aspirin every day. Taking vitamin and mineral supplements as recommended by your health care provider. What happens during an annual well check? The services and screenings done by your health care provider during your annual well check will depend on your age, overall health, lifestyle risk factors, and family history of disease. Counseling  Your health care provider may ask you questions about your: Alcohol use. Tobacco use. Drug use. Emotional well-being. Home and relationship well-being. Sexual activity. Eating habits. History of falls. Memory and ability to understand (cognition). Work and work Statistician. Reproductive health. Screening  You may have the following tests or measurements: Height, weight, and BMI. Blood pressure. Lipid and cholesterol levels. These may be checked every 5 years, or more frequently if you are over 55 years old. Skin check. Lung cancer screening. You may have this screening every year starting at age 15 if you have a 30-pack-year history of smoking and currently smoke or have quit within the past 15 years. Fecal occult blood test (FOBT) of the stool. You may have this test every year starting at age 5. Flexible sigmoidoscopy or colonoscopy. You may have a sigmoidoscopy every 5 years or a colonoscopy every 10 years starting at age 57. Hepatitis C  blood test. Hepatitis B blood test. Sexually transmitted disease (STD) testing. Diabetes screening. This is done by checking your blood sugar (glucose) after you have not eaten for a while (fasting). You may have this done every 1-3 years. Bone density scan. This is done to screen for osteoporosis. You may have this done starting at age 55. Mammogram. This may be done every 1-2 years. Talk to your health care provider  about how often you should have regular mammograms. Talk with your health care provider about your test results, treatment options, and if necessary, the need for more tests. Vaccines  Your health care provider may recommend certain vaccines, such as: Influenza vaccine. This is recommended every year. Tetanus, diphtheria, and acellular pertussis (Tdap, Td) vaccine. You may need a Td booster every 10 years. Zoster vaccine. You may need this after age 27. Pneumococcal 13-valent conjugate (PCV13) vaccine. One dose is recommended after age 30. Pneumococcal polysaccharide (PPSV23) vaccine. One dose is recommended after age 17. Talk to your health care provider about which screenings and vaccines you need and how often you need them. This information is not intended to replace advice given to you by your health care provider. Make sure you discuss any questions you have with your health care provider. Document Released: 05/25/2015 Document Revised: 01/16/2016 Document Reviewed: 02/27/2015 Elsevier Interactive Patient Education  2017 [place] Prevention in the Home Falls can cause injuries. They can happen to people of all ages. There are many things you can do to make your home safe and to help prevent falls. What can I do on the outside of my home? Regularly fix the edges of walkways and driveways and fix any cracks. Remove anything that might make you trip as you walk through a door, such as a raised step or threshold. Trim any bushes or trees on the path to your home. Use bright outdoor lighting. Clear any walking paths of anything that might make someone trip, such as rocks or tools. Regularly check to see if handrails are loose or broken. Make sure that both sides of any steps have handrails. Any raised decks and porches should have guardrails on the edges. Have any leaves, snow, or ice cleared regularly. Use sand or salt on walking paths during winter. Clean up any spills in  your garage right away. This includes oil or grease spills. What can I do in the bathroom? Use night lights. Install grab bars by the toilet and in the tub and shower. Do not use towel bars as grab bars. Use non-skid mats or decals in the tub or shower. If you need to sit down in the shower, use a plastic, non-slip stool. Keep the floor dry. Clean up any water that spills on the floor as soon as it happens. Remove soap buildup in the tub or shower regularly. Attach bath mats securely with double-sided non-slip rug tape. Do not have throw rugs and other things on the floor that can make you trip. What can I do in the bedroom? Use night lights. Make sure that you have a light by your bed that is easy to reach. Do not use any sheets or blankets that are too big for your bed. They should not hang down onto the floor. Have a firm chair that has side arms. You can use this for support while you get dressed. Do not have throw rugs and other things on the floor that can make you trip. What can I do in the kitchen?  Clean up any spills right away. Avoid walking on wet floors. Keep items that you use a lot in easy-to-reach places. If you need to reach something above you, use a strong step stool that has a grab bar. Keep electrical cords out of the way. Do not use floor polish or wax that makes floors slippery. If you must use wax, use non-skid floor wax. Do not have throw rugs and other things on the floor that can make you trip. What can I do with my stairs? Do not leave any items on the stairs. Make sure that there are handrails on both sides of the stairs and use them. Fix handrails that are broken or loose. Make sure that handrails are as long as the stairways. Check any carpeting to make sure that it is firmly attached to the stairs. Fix any carpet that is loose or worn. Avoid having throw rugs at the top or bottom of the stairs. If you do have throw rugs, attach them to the floor with carpet  tape. Make sure that you have a light switch at the top of the stairs and the bottom of the stairs. If you do not have them, ask someone to add them for you. What else can I do to help prevent falls? Wear shoes that: Do not have high heels. Have rubber bottoms. Are comfortable and fit you well. Are closed at the toe. Do not wear sandals. If you use a stepladder: Make sure that it is fully opened. Do not climb a closed stepladder. Make sure that both sides of the stepladder are locked into place. Ask someone to hold it for you, if possible. Clearly mark and make sure that you can see: Any grab bars or handrails. First and last steps. Where the edge of each step is. Use tools that help you move around (mobility aids) if they are needed. These include: Canes. Walkers. Scooters. Crutches. Turn on the lights when you go into a dark area. Replace any light bulbs as soon as they burn out. Set up your furniture so you have a clear path. Avoid moving your furniture around. If any of your floors are uneven, fix them. If there are any pets around you, be aware of where they are. Review your medicines with your doctor. Some medicines can make you feel dizzy. This can increase your chance of falling. Ask your doctor what other things that you can do to help prevent falls. This information is not intended to replace advice given to you by your health care provider. Make sure you discuss any questions you have with your health care provider. Document Released: 02/22/2009 Document Revised: 10/04/2015 Document Reviewed: 06/02/2014 Elsevier Interactive Patient Education  2017 [employer].

## 2022-07-09 NOTE — Progress Notes (Signed)
Subjective:   Armanii Fietz is a 72 y.o. female who presents for Medicare Annual (Subsequent) preventive examination.  Review of Systems     Cardiac Risk Factors include: advanced age (>17mn, >>83women);dyslipidemia;hypertension     Objective:    Today's Vitals   07/09/22 1033  Weight: 150 lb (68 kg)   Body mass index is 23.49 kg/m.     07/09/2022   10:41 AM 09/20/2018    2:13 PM  Advanced Directives  Does Patient Have a Medical Advance Directive? No No  Would patient like information on creating a medical advance directive? No - Patient declined Yes (ED - Information included in AVS)    Current Medications (verified) Outpatient Encounter Medications as of 07/09/2022  Medication Sig   aspirin EC 81 MG tablet Take 81 mg by mouth daily.   Multiple Vitamin (MULTIVITAMIN) tablet Take 1 tablet by mouth daily.   rosuvastatin (CRESTOR) 5 MG tablet Take 1 tablet (5 mg total) by mouth daily.   triamterene-hydrochlorothiazide (DYAZIDE) 37.5-25 MG capsule Take 1 each (1 capsule total) by mouth daily.   vitamin B-12 (CYANOCOBALAMIN) 1000 MCG tablet Take 1,000 mcg by mouth daily.   [DISCONTINUED] thiamine (VITAMIN B-1) 100 MG tablet Take 100 mg by mouth daily.   ferrous sulfate 325 (65 FE) MG tablet Take 325 mg by mouth daily with breakfast. (Patient not taking: Reported on 07/09/2022)   thiamine (VITAMIN B-1) 50 MG tablet Take 1 tablet (50 mg total) by mouth every other day. (Patient not taking: Reported on 07/09/2022)   No facility-administered encounter medications on file as of 07/09/2022.    Allergies (verified) Patient has no known allergies.   History: Past Medical History:  Diagnosis Date   DJD (degenerative joint disease) of knee 11/29/2012   DOE (dyspnea on exertion) 07/24/2015   Essential hypertension 07/24/2015   Routine general medical examination at a health care facility 11/29/2012   Screen for colon cancer 07/24/2015   Past Surgical History:  Procedure Laterality  Date   TUBAL LIGATION     WISDOM TOOTH EXTRACTION     Family History  Problem Relation Age of Onset   Heart disease Sister    Heart disease Brother    Alcohol abuse Neg Hx    Cancer Neg Hx    COPD Neg Hx    Diabetes Neg Hx    Early death Neg Hx    Drug abuse Neg Hx    Hyperlipidemia Neg Hx    Hypertension Neg Hx    Kidney disease Neg Hx    Stroke Neg Hx    Social History   Socioeconomic History   Marital status: Single    Spouse name: Not on file   Number of children: 2   Years of education: Not on file   Highest education level: Not on file  Occupational History   Occupation: part time at bank  Tobacco Use   Smoking status: Never   Smokeless tobacco: Never  Vaping Use   Vaping Use: Never used  Substance and Sexual Activity   Alcohol use: Yes    Alcohol/week: 2.0 standard drinks of alcohol    Types: 2 Glasses of wine per week   Drug use: No   Sexual activity: Not Currently  Other Topics Concern   Not on file  Social History Narrative   Not on file   Social Determinants of Health   Financial Resource Strain: Low Risk  (07/09/2022)   Overall Financial Resource Strain (CARDIA)  Difficulty of Paying Living Expenses: Not hard at all  Food Insecurity: No Food Insecurity (07/09/2022)   Hunger Vital Sign    Worried About Running Out of Food in the Last Year: Never true    Ran Out of Food in the Last Year: Never true  Transportation Needs: No Transportation Needs (07/09/2022)   PRAPARE - Hydrologist (Medical): No    Lack of Transportation (Non-Medical): No  Physical Activity: Sufficiently Active (07/09/2022)   Exercise Vital Sign    Days of Exercise per Week: 5 days    Minutes of Exercise per Session: 60 min  Stress: No Stress Concern Present (07/09/2022)   [place]    Feeling of Stress : Not at all  Social Connections: Moderately Integrated (07/09/2022)   Social  Connection and Isolation Panel [NHANES]    Frequency of Communication with Friends and Family: Three times a week    Frequency of Social Gatherings with Friends and Family: Three times a week    Attends Religious Services: 1 to 4 times per year    Active Member of Clubs or Organizations: Yes    Attends Archivist Meetings: 1 to 4 times per year    Marital Status: Divorced    Tobacco Counseling Counseling given: Not Answered   Clinical Intake:  Pre-visit preparation completed: Yes  Pain : No/denies pain     BMI - recorded: 23.49 Nutritional Status: BMI of 19-24  Normal Nutritional Risks: None Diabetes: No  How often do you need to have someone help you when you read instructions, pamphlets, or other written materials from your doctor or pharmacy?: 1 - Never  Diabetic? no  Interpreter Needed?: No  Information entered by :: Jaquarious Grey, LPN   Activities of Daily Living    07/09/2022   10:41 AM  In your present state of health, do you have any difficulty performing the following activities:  Hearing? 0  Vision? 0  Difficulty concentrating or making decisions? 0  Walking or climbing stairs? 0  Dressing or bathing? 0  Doing errands, shopping? 0  Preparing Food and eating ? N  Using the Toilet? N  In the past six months, have you accidently leaked urine? N  Do you have problems with loss of bowel control? N  Managing your Medications? N  Managing your Finances? N  Housekeeping or managing your Housekeeping? N    Patient Care Team: Janith Lima, MD as PCP - General (Internal Medicine)  Indicate any recent Medical Services you may have received from other than [facility] providers in the past year (date may be approximate).     Assessment:   This is a routine wellness examination for Naidelin.  Hearing/Vision screen Hearing Screening - Comments:: Denies hearing difficulties   Vision Screening - Comments:: No vision concerns; no regular  optometrist  Dietary issues and exercise activities discussed: Current Exercise Habits: Home exercise routine, Type of exercise: walking, Time (Minutes): 60, Frequency (Times/Week): 5, Weekly Exercise (Minutes/Week): 300, Intensity: Mild, Exercise limited by: None identified   Goals Addressed   None    Depression Screen    07/09/2022   10:39 AM 08/12/2021    1:25 PM 06/19/2020    2:14 PM 05/23/2019    3:27 PM 09/20/2018    2:13 PM 09/14/2017    4:54 PM 09/14/2017   11:02 AM  PHQ 2/9 Scores  PHQ - 2 Score 0 0 0 0 0 0  0    Fall Risk    07/09/2022   10:34 AM 08/12/2021    1:25 PM 06/19/2020    2:13 PM 05/23/2019    3:27 PM 09/20/2018    2:13 PM  Fall Risk   Falls in the past year? 0 0 0 0 0  Number falls in past yr: 0   0 0  Injury with Fall? 0   0 1  Risk for fall due to : No Fall Risks      Follow up Falls prevention discussed   Falls evaluation completed     FALL RISK PREVENTION PERTAINING TO THE HOME:  Any stairs in or around the home? Yes  If so, are there any without handrails? No  Home free of loose throw rugs in walkways, pet beds, electrical cords, etc? Yes  Adequate lighting in your home to reduce risk of falls? Yes   ASSISTIVE DEVICES UTILIZED TO PREVENT FALLS:  Life alert? No  Use of a cane, walker or w/c? No  Grab bars in the bathroom? No  Shower chair or bench in shower? No  Elevated toilet seat or a handicapped toilet? No   TIMED UP AND GO:  Was the test performed? No . televisit  Cognitive Function:        07/09/2022   10:41 AM  6CIT Screen  What Year? 0 points  What month? 0 points  What time? 0 points  Count back from 20 0 points  Months in reverse 0 points  Repeat phrase 2 points  Total Score 2 points    Immunizations Immunization History  Administered Date(s) Administered   Pneumococcal Conjugate-13 09/14/2017   Pneumococcal Polysaccharide-23 11/29/2012, 05/23/2019   Tdap 11/29/2012    TDAP status: Up to date  Flu Vaccine status:  Declined, Education has been provided regarding the importance of this vaccine but patient still declined. Advised may receive this vaccine at local pharmacy or Health Dept. Aware to provide a copy of the vaccination record if obtained from local pharmacy or Health Dept. Verbalized acceptance and understanding.  Pneumococcal vaccine status: Up to date  Covid-19 vaccine status: Declined, Education has been provided regarding the importance of this vaccine but patient still declined. Advised may receive this vaccine at local pharmacy or Health Dept.or vaccine clinic. Aware to provide a copy of the vaccination record if obtained from local pharmacy or Health Dept. Verbalized acceptance and understanding.  Qualifies for Shingles Vaccine? Yes   Zostavax completed No   Shingrix Completed?: No.    Education has been provided regarding the importance of this vaccine. Patient has been advised to call insurance company to determine out of pocket expense if they have not yet received this vaccine. Advised may also receive vaccine at local pharmacy or Health Dept. Verbalized acceptance and understanding.  Screening Tests Health Maintenance  Topic Date Due   DEXA SCAN  Never done   COVID-19 Vaccine (1) 07/25/2022 (Originally 07/09/1951)   INFLUENZA VACCINE  08/10/2022 (Originally 12/10/2021)   Zoster Vaccines- Shingrix (1 of 2) 10/07/2022 (Originally 01/09/2002)   Fecal DNA (Cologuard)  07/10/2023 (Originally 01/09/1997)   DTaP/Tdap/Td (2 - Td or Tdap) 11/30/2022   Medicare Annual Wellness (AWV)  07/10/2023   MAMMOGRAM  06/26/2024   Pneumonia Vaccine 41+ Years old  Completed   Hepatitis C Screening  Completed   HPV VACCINES  Aged Out    Health Maintenance  Health Maintenance Due  Topic Date Due   DEXA SCAN  Never done  Declined Colorectal Cancer Screening, but will see when her daughter can take her and let us know  Mammogram status: Completed 06/26/2022. Repeat every year  Bone Density status:  Ordered 05/09/2023. Pt provided with contact info and advised to call to schedule appt.  Lung Cancer Screening: (Low Dose CT Chest recommended if Age 73-80 years, 30 pack-year currently smoking OR have quit w/in 15years.) does not qualify.   Additional Screening:  Hepatitis C Screening: does qualify; Completed 07/24/2015  Vision Screening: Recommended annual ophthalmology exams for early detection of glaucoma and other disorders of the eye. Is the patient up to date with their annual eye exam?  No  Who is the provider or what is the name of the office in which the patient attends annual eye exams? none If pt is not established with a provider, would they like to be referred to a provider to establish care? No .   Dental Screening: Recommended annual dental exams for proper oral hygiene  Community Resource Referral / Chronic Care Management: CRR required this visit?  No   CCM required this visit?  No      Plan:     I have personally reviewed and noted the following in the patient's chart:   Medical and social history Use of alcohol, tobacco or illicit drugs  Current medications and supplements including opioid prescriptions. Patient is not currently taking opioid prescriptions. Functional ability and status Nutritional status Physical activity Advanced directives List of other physicians Hospitalizations, surgeries, and ER visits in previous 12 months Vitals Screenings to include cognitive, depression, and falls Referrals and appointments  In addition, I have reviewed and discussed with patient certain preventive protocols, quality metrics, and best practice recommendations. A written personalized care plan for preventive services as well as general preventive health recommendations were provided to patient.     Sandrea Hammond, LPN   [id number]   Nurse Notes: ordered DEXA. She wants to do Colonoscopy, but has to arrange transportation with her daughter first.

## 2022-07-09 NOTE — Telephone Encounter (Signed)
Needs rf on Thiamin/B1

## 2022-08-27 ENCOUNTER — Other Ambulatory Visit: Payer: Self-pay | Admitting: Internal Medicine

## 2022-08-27 DIAGNOSIS — I10 Essential (primary) hypertension: Secondary | ICD-10-CM

## 2022-08-27 DIAGNOSIS — E785 Hyperlipidemia, unspecified: Secondary | ICD-10-CM

## 2022-09-02 ENCOUNTER — Telehealth: Payer: Self-pay | Admitting: Internal Medicine

## 2022-09-02 NOTE — Telephone Encounter (Signed)
Prescription Request  09/02/2022  LOV: 02/27/2022  What is the name of the medication or equipment? rosuvastatin (CRESTOR) 5 MG tablet  Have you contacted your pharmacy to request a refill? Yes   Which pharmacy would you like this sent to?  [id number] [place], [place] - [address] [place] [address] Phone: [phone] Fax: [phone]    Patient notified that their request is being sent to the clinical staff for review and that they should receive a response within 2 business days.   Please advise at Mobile [phone] (mobile)

## 2022-09-02 NOTE — Telephone Encounter (Signed)
MD denied crestor refill " Patient needs an appointment ". Called pt made appt for 09/16/22 @ 2:40.Marland KitchenRaechel Chute

## 2022-09-09 ENCOUNTER — Ambulatory Visit: Payer: Medicare PPO | Admitting: Internal Medicine

## 2022-09-16 ENCOUNTER — Ambulatory Visit (INDEPENDENT_AMBULATORY_CARE_PROVIDER_SITE_OTHER): Payer: Medicare PPO | Admitting: Internal Medicine

## 2022-09-16 ENCOUNTER — Encounter: Payer: Self-pay | Admitting: Internal Medicine

## 2022-09-16 VITALS — BP 144/88 | HR 70 | Temp 98.4°F | Resp 16 | Ht 67.0 in | Wt 151.0 lb

## 2022-09-16 DIAGNOSIS — Z1211 Encounter for screening for malignant neoplasm of colon: Secondary | ICD-10-CM

## 2022-09-16 DIAGNOSIS — I10 Essential (primary) hypertension: Secondary | ICD-10-CM | POA: Diagnosis not present

## 2022-09-16 DIAGNOSIS — E519 Thiamine deficiency, unspecified: Secondary | ICD-10-CM | POA: Diagnosis not present

## 2022-09-16 DIAGNOSIS — E038 Other specified hypothyroidism: Secondary | ICD-10-CM | POA: Diagnosis not present

## 2022-09-16 DIAGNOSIS — Z0000 Encounter for general adult medical examination without abnormal findings: Secondary | ICD-10-CM

## 2022-09-16 DIAGNOSIS — Z0001 Encounter for general adult medical examination with abnormal findings: Secondary | ICD-10-CM | POA: Insufficient documentation

## 2022-09-16 DIAGNOSIS — E785 Hyperlipidemia, unspecified: Secondary | ICD-10-CM

## 2022-09-16 DIAGNOSIS — N182 Chronic kidney disease, stage 2 (mild): Secondary | ICD-10-CM

## 2022-09-16 DIAGNOSIS — D538 Other specified nutritional anemias: Secondary | ICD-10-CM

## 2022-09-16 DIAGNOSIS — D539 Nutritional anemia, unspecified: Secondary | ICD-10-CM | POA: Diagnosis not present

## 2022-09-16 LAB — CBC WITH DIFFERENTIAL/PLATELET
Basophils Absolute: 0.1 10*3/uL (ref 0.0–0.1)
Basophils Relative: 1.5 % (ref 0.0–3.0)
Eosinophils Absolute: 0.3 10*3/uL (ref 0.0–0.7)
Eosinophils Relative: 5.4 % — ABNORMAL HIGH (ref 0.0–5.0)
HCT: 37.7 % (ref 36.0–46.0)
Hemoglobin: 11.8 g/dL — ABNORMAL LOW (ref 12.0–15.0)
Lymphocytes Relative: 31.3 % (ref 12.0–46.0)
Lymphs Abs: 1.8 10*3/uL (ref 0.7–4.0)
MCHC: 31.3 g/dL (ref 30.0–36.0)
MCV: 76.9 fl — ABNORMAL LOW (ref 78.0–100.0)
Monocytes Absolute: 0.4 10*3/uL (ref 0.1–1.0)
Monocytes Relative: 7.1 % (ref 3.0–12.0)
Neutro Abs: 3.1 10*3/uL (ref 1.4–7.7)
Neutrophils Relative %: 54.7 % (ref 43.0–77.0)
Platelets: 260 10*3/uL (ref 150.0–400.0)
RBC: 4.9 Mil/uL (ref 3.87–5.11)
RDW: 16.5 % — ABNORMAL HIGH (ref 11.5–15.5)
WBC: 5.6 10*3/uL (ref 4.0–10.5)

## 2022-09-16 LAB — BASIC METABOLIC PANEL
BUN: 13 mg/dL (ref 6–23)
CO2: 27 mEq/L (ref 19–32)
Calcium: 9.7 mg/dL (ref 8.4–10.5)
Chloride: 104 mEq/L (ref 96–112)
Creatinine, Ser: 0.77 mg/dL (ref 0.40–1.20)
GFR: 78.01 mL/min (ref >60.00)
Glucose, Bld: 92 mg/dL (ref 70–99)
Potassium: 4.3 mEq/L (ref 3.5–5.1)
Sodium: 140 mEq/L (ref 135–145)

## 2022-09-16 LAB — HEPATIC FUNCTION PANEL
ALT: 19 U/L (ref 0–35)
AST: 25 U/L (ref 0–37)
Albumin: 4.5 g/dL (ref 3.5–5.2)
Alkaline Phosphatase: 53 U/L (ref 39–117)
Bilirubin, Direct: 0.1 mg/dL (ref 0.0–0.3)
Total Bilirubin: 0.4 mg/dL (ref 0.2–1.2)
Total Protein: 7.8 g/dL (ref 6.0–8.3)

## 2022-09-16 LAB — LIPID PANEL
Cholesterol: 193 mg/dL (ref 0–200)
HDL: 87.8 mg/dL (ref >39.00)
LDL Cholesterol: 74 mg/dL (ref 0–99)
NonHDL: 105.49
Total CHOL/HDL Ratio: 2
Triglycerides: 158 mg/dL — ABNORMAL HIGH (ref 0.0–149.0)
VLDL: 31.6 mg/dL (ref 0.0–40.0)

## 2022-09-16 LAB — TSH: TSH: 2.72 u[IU]/mL (ref 0.35–5.50)

## 2022-09-16 NOTE — Patient Instructions (Signed)

## 2022-09-16 NOTE — Progress Notes (Unsigned)
Subjective:  Patient ID: Anne Ng, female    DOB: [birth year]  Age: 72 y.o. MRN: [id number]  CC: Annual Exam, Hypertension, and Hyperlipidemia   HPI Alaylah Kuhnert presents for ***  Outpatient Medications Prior to Visit  Medication Sig Dispense Refill   aspirin EC 81 MG tablet Take 81 mg by mouth daily.     ferrous sulfate 325 (65 FE) MG tablet Take 325 mg by mouth daily with breakfast.     Multiple Vitamin (MULTIVITAMIN) tablet Take 1 tablet by mouth daily.     rosuvastatin (CRESTOR) 5 MG tablet Take 1 tablet (5 mg total) by mouth daily. 90 tablet 1   thiamine (VITAMIN B-1) 100 MG tablet Take 1 tablet (100 mg total) by mouth daily. 90 tablet 1   thiamine (VITAMIN B-1) 50 MG tablet Take 1 tablet (50 mg total) by mouth every other day. 45 tablet 1   triamterene-hydrochlorothiazide (DYAZIDE) 37.5-25 MG capsule Take 1 each (1 capsule total) by mouth daily. 90 capsule 1   vitamin B-12 (CYANOCOBALAMIN) 1000 MCG tablet Take 1,000 mcg by mouth daily.     No facility-administered medications prior to visit.    ROS Review of Systems  Objective:  BP (!) 144/88 (BP Location: Left Arm, Patient Position: Sitting, Cuff Size: Normal)   Pulse 70   Temp 98.4 F (36.9 C) (Oral)   Resp 16   Ht 5\' 7"  (1.702 m)   Wt 151 lb (68.5 kg)   SpO2 99%   BMI 23.65 kg/m   BP Readings from Last 3 Encounters:  09/16/22 (!) 144/88  02/27/22 126/80  08/12/21 (!) 144/88    Wt Readings from Last 3 Encounters:  09/16/22 151 lb (68.5 kg)  07/09/22 150 lb (68 kg)  02/27/22 150 lb (68 kg)    Physical Exam Vitals reviewed.  Constitutional:      Appearance: Normal appearance.  HENT:     Nose: Nose normal.     Mouth/Throat:     Mouth: Mucous membranes are moist.  Eyes:     General: No scleral icterus.    Conjunctiva/sclera: Conjunctivae normal.  Cardiovascular:     Rate and Rhythm: Normal rate and regular rhythm.     Heart sounds: Normal heart sounds, S1 normal and S2 normal. No murmur  heard.    No gallop.     Comments: EKG--- NSR, 64 bpm No LVH or Q waves Normal EKG Pulmonary:     Effort: Pulmonary effort is normal.     Breath sounds: No stridor. No wheezing, rhonchi or rales.  Abdominal:     Palpations: There is no mass.     Tenderness: There is no abdominal tenderness. There is no guarding.     Hernia: No hernia is present.  Musculoskeletal:     Cervical back: Neck supple.     Right lower leg: No edema.     Left lower leg: No edema.  Lymphadenopathy:     Cervical: No cervical adenopathy.  Skin:    General: Skin is warm and dry.     Findings: No rash.  Neurological:     General: No focal deficit present.     Mental Status: She is alert. Mental status is at baseline.  Psychiatric:        Mood and Affect: Mood normal.        Behavior: Behavior normal.     Lab Results  Component Value Date   WBC 7.1 02/27/2022   HGB 11.7 (L) 02/27/2022   HCT  37.1 02/27/2022   PLT 271.0 02/27/2022   GLUCOSE 92 02/27/2022   CHOL 134 08/12/2021   TRIG 122.0 08/12/2021   HDL 67.10 08/12/2021   LDLDIRECT 81.0 07/24/2015   LDLCALC 42 08/12/2021   ALT 19 08/12/2021   AST 19 08/12/2021   NA 139 02/27/2022   K 4.4 02/27/2022   CL 105 02/27/2022   CREATININE 0.89 02/27/2022   BUN 16 02/27/2022   CO2 28 02/27/2022   TSH 3.31 08/12/2021    MM 3D SCREEN BREAST BILATERAL  Result Date: 06/27/2022 CLINICAL DATA:  Screening. EXAM: DIGITAL SCREENING BILATERAL MAMMOGRAM WITH TOMOSYNTHESIS AND CAD TECHNIQUE: Bilateral screening digital craniocaudal and mediolateral oblique mammograms were obtained. Bilateral screening digital breast tomosynthesis was performed. The images were evaluated with computer-aided detection. COMPARISON:  Previous exam(s). ACR Breast Density Category b: There are scattered areas of fibroglandular density. FINDINGS: There are no findings suspicious for malignancy. IMPRESSION: No mammographic evidence of malignancy. A result letter of this screening  mammogram will be mailed directly to the patient. RECOMMENDATION: Screening mammogram in one year. (Code:SM-B-01Y) BI-RADS CATEGORY  1: Negative. Electronically Signed   By: Sherron Ales M.D.   On: 06/27/2022 17:38    Assessment & Plan:   Anemia due to acquired thiamine deficiency -     CBC with Differential/Platelet; Future  Essential hypertension -     TSH; Future -     Basic metabolic panel; Future  Hyperlipidemia LDL goal <130 -     Lipid panel; Future -     TSH; Future -     Hepatic function panel; Future  Subclinical hypothyroidism -     TSH; Future  Encounter for general adult medical examination with abnormal findings  Screening for colon cancer -     Ambulatory referral to Gastroenterology     Follow-up: No follow-ups on file.  Sanda Linger, MD

## 2022-09-17 ENCOUNTER — Encounter: Payer: Self-pay | Admitting: Internal Medicine

## 2022-09-17 DIAGNOSIS — Z1211 Encounter for screening for malignant neoplasm of colon: Secondary | ICD-10-CM | POA: Insufficient documentation

## 2022-09-17 DIAGNOSIS — N182 Chronic kidney disease, stage 2 (mild): Secondary | ICD-10-CM | POA: Insufficient documentation

## 2022-09-17 MED ORDER — TRIAMTERENE-HCTZ 37.5-25 MG PO CAPS
1.0000 | ORAL_CAPSULE | Freq: Every day | ORAL | 1 refills | Status: DC
Start: 2022-09-17 — End: 2023-03-19

## 2022-09-17 MED ORDER — VITAMIN B-12 1000 MCG PO TABS
1000.0000 ug | ORAL_TABLET | Freq: Every day | ORAL | 1 refills | Status: DC
Start: 2022-09-17 — End: 2023-12-22

## 2022-09-17 MED ORDER — VITAMIN B-1 100 MG PO TABS: 100.0000 mg | ORAL_TABLET | Freq: Every day | ORAL | 1 refills | Status: DC

## 2022-09-17 MED ORDER — ROSUVASTATIN CALCIUM 5 MG PO TABS: 5.0000 mg | ORAL_TABLET | Freq: Every day | ORAL | 1 refills | Status: DC

## 2023-01-23 ENCOUNTER — Other Ambulatory Visit: Payer: Medicare PPO

## 2023-01-30 ENCOUNTER — Other Ambulatory Visit: Payer: Medicare PPO

## 2023-02-26 ENCOUNTER — Other Ambulatory Visit: Payer: Medicare PPO

## 2023-03-15 ENCOUNTER — Other Ambulatory Visit: Payer: Self-pay | Admitting: Internal Medicine

## 2023-03-15 DIAGNOSIS — E785 Hyperlipidemia, unspecified: Secondary | ICD-10-CM

## 2023-03-15 DIAGNOSIS — I10 Essential (primary) hypertension: Secondary | ICD-10-CM

## 2023-03-19 ENCOUNTER — Telehealth: Payer: Self-pay | Admitting: Internal Medicine

## 2023-03-19 ENCOUNTER — Other Ambulatory Visit: Payer: Self-pay

## 2023-03-19 DIAGNOSIS — E785 Hyperlipidemia, unspecified: Secondary | ICD-10-CM

## 2023-03-19 DIAGNOSIS — I10 Essential (primary) hypertension: Secondary | ICD-10-CM

## 2023-03-19 MED ORDER — ROSUVASTATIN CALCIUM 5 MG PO TABS: 5.0000 mg | ORAL_TABLET | Freq: Every day | ORAL | 0 refills | Status: DC

## 2023-03-19 MED ORDER — TRIAMTERENE-HCTZ 37.5-25 MG PO CAPS
1.0000 | ORAL_CAPSULE | Freq: Every day | ORAL | 0 refills | Status: DC
Start: 2023-03-19 — End: 2023-04-18

## 2023-03-19 NOTE — Telephone Encounter (Signed)
Patient has scheduled an appointment for Dr. Yetta Barre' soonest opening on 04/15/2023. She said she is out of her medication and would like to know if she can get enough to last until her appointment.   Prescription Request  03/19/2023  LOV: 09/16/2022  What is the name of the medication or equipment? triamterene-hydrochlorothiazide (DYAZIDE) 37.5-25 MG capsule  rosuvastatin (CRESTOR) 5 MG tablet  Have you contacted your pharmacy to request a refill? Yes   Which pharmacy would you like this sent to?  [id number] [place], [place] - [address] [place] [address] Phone: [phone] Fax: [phone]    Patient notified that their request is being sent to the clinical staff for review and that they should receive a response within 2 business days.   Please advise at Mobile [phone] (mobile)

## 2023-03-19 NOTE — Telephone Encounter (Signed)
 Medication refill sent to Dr. Yetta Barre

## 2023-03-24 ENCOUNTER — Other Ambulatory Visit: Payer: Self-pay | Admitting: Internal Medicine

## 2023-03-24 DIAGNOSIS — D538 Other specified nutritional anemias: Secondary | ICD-10-CM

## 2023-04-10 ENCOUNTER — Other Ambulatory Visit: Payer: Self-pay | Admitting: Internal Medicine

## 2023-04-10 DIAGNOSIS — E785 Hyperlipidemia, unspecified: Secondary | ICD-10-CM

## 2023-04-15 ENCOUNTER — Encounter: Payer: Self-pay | Admitting: Internal Medicine

## 2023-04-15 ENCOUNTER — Ambulatory Visit: Payer: Medicare PPO | Admitting: Internal Medicine

## 2023-04-15 VITALS — BP 138/86 | HR 67 | Temp 97.8°F | Resp 16 | Ht 67.0 in | Wt 151.6 lb

## 2023-04-15 DIAGNOSIS — N182 Chronic kidney disease, stage 2 (mild): Secondary | ICD-10-CM | POA: Diagnosis not present

## 2023-04-15 DIAGNOSIS — E519 Thiamine deficiency, unspecified: Secondary | ICD-10-CM | POA: Diagnosis not present

## 2023-04-15 DIAGNOSIS — D538 Other specified nutritional anemias: Secondary | ICD-10-CM | POA: Diagnosis not present

## 2023-04-15 DIAGNOSIS — E2839 Other primary ovarian failure: Secondary | ICD-10-CM | POA: Insufficient documentation

## 2023-04-15 DIAGNOSIS — Z1211 Encounter for screening for malignant neoplasm of colon: Secondary | ICD-10-CM

## 2023-04-15 DIAGNOSIS — I10 Essential (primary) hypertension: Secondary | ICD-10-CM | POA: Diagnosis not present

## 2023-04-15 DIAGNOSIS — Z23 Encounter for immunization: Secondary | ICD-10-CM | POA: Insufficient documentation

## 2023-04-15 LAB — URINALYSIS, ROUTINE W REFLEX MICROSCOPIC
Bilirubin Urine: NEGATIVE
Hgb urine dipstick: NEGATIVE
Ketones, ur: NEGATIVE
Leukocytes,Ua: NEGATIVE
Nitrite: NEGATIVE
RBC / HPF: NONE SEEN (ref 0–?)
Specific Gravity, Urine: 1.015 (ref 1.000–1.030)
Total Protein, Urine: NEGATIVE
Urine Glucose: NEGATIVE
Urobilinogen, UA: 0.2 (ref 0.0–1.0)
pH: 7.5 (ref 5.0–8.0)

## 2023-04-15 LAB — CBC WITH DIFFERENTIAL/PLATELET
Basophils Absolute: 0 10*3/uL (ref 0.0–0.1)
Basophils Relative: 0.7 % (ref 0.0–3.0)
Eosinophils Absolute: 0.4 10*3/uL (ref 0.0–0.7)
Eosinophils Relative: 5.2 % — ABNORMAL HIGH (ref 0.0–5.0)
HCT: 38.2 % (ref 36.0–46.0)
Hemoglobin: 12.1 g/dL (ref 12.0–15.0)
Lymphocytes Relative: 33.9 % (ref 12.0–46.0)
Lymphs Abs: 2.5 10*3/uL (ref 0.7–4.0)
MCHC: 31.7 g/dL (ref 30.0–36.0)
MCV: 76.9 fL — ABNORMAL LOW (ref 78.0–100.0)
Monocytes Absolute: 0.6 10*3/uL (ref 0.1–1.0)
Monocytes Relative: 8 % (ref 3.0–12.0)
Neutro Abs: 3.8 10*3/uL (ref 1.4–7.7)
Neutrophils Relative %: 52.2 % (ref 43.0–77.0)
Platelets: 295 10*3/uL (ref 150.0–400.0)
RBC: 4.97 Mil/uL (ref 3.87–5.11)
RDW: 16.4 % — ABNORMAL HIGH (ref 11.5–15.5)
WBC: 7.3 10*3/uL (ref 4.0–10.5)

## 2023-04-15 LAB — BASIC METABOLIC PANEL
BUN: 19 mg/dL (ref 6–23)
CO2: 30 meq/L (ref 19–32)
Calcium: 10.2 mg/dL (ref 8.4–10.5)
Chloride: 104 meq/L (ref 96–112)
Creatinine, Ser: 0.79 mg/dL (ref 0.40–1.20)
GFR: 75.34 mL/min (ref >60.00)
Glucose, Bld: 94 mg/dL (ref 70–99)
Potassium: 4.4 meq/L (ref 3.5–5.1)
Sodium: 140 meq/L (ref 135–145)

## 2023-04-15 MED ORDER — SHINGRIX 50 MCG/0.5ML IM SUSR: 0.5000 mL | Freq: Once | INTRAMUSCULAR | 1 refills | Status: AC

## 2023-04-15 MED ORDER — BOOSTRIX 5-2.5-18.5 LF-MCG/0.5 IM SUSP: 0.5000 mL | Freq: Once | INTRAMUSCULAR | 0 refills | Status: AC

## 2023-04-15 NOTE — Patient Instructions (Signed)
Hypertension, Adult High blood pressure (hypertension) is when the force of blood pumping through the arteries is too strong. The arteries are the blood vessels that carry blood from the heart throughout the body. Hypertension forces the heart to work harder to pump blood and may cause arteries to become narrow or stiff. Untreated or uncontrolled hypertension can lead to a heart attack, heart failure, a stroke, kidney disease, and other problems. A blood pressure reading consists of a higher number over a lower number. Ideally, your blood pressure should be below 120/80. The first ("top") number is called the systolic pressure. It is a measure of the pressure in your arteries as your heart beats. The second ("bottom") number is called the diastolic pressure. It is a measure of the pressure in your arteries as the heart relaxes. What are the causes? The exact cause of this condition is not known. There are some conditions that result in high blood pressure. What increases the risk? Certain factors may make you more likely to develop high blood pressure. Some of these risk factors are under your control, including: Smoking. Not getting enough exercise or physical activity. Being overweight. Having too much fat, sugar, calories, or salt (sodium) in your diet. Drinking too much alcohol. Other risk factors include: Having a personal history of heart disease, diabetes, high cholesterol, or kidney disease. Stress. Having a family history of high blood pressure and high cholesterol. Having obstructive sleep apnea. Age. The risk increases with age. What are the signs or symptoms? High blood pressure may not cause symptoms. Very high blood pressure (hypertensive crisis) may cause: Headache. Fast or irregular heartbeats (palpitations). Shortness of breath. Nosebleed. Nausea and vomiting. Vision changes. Severe chest pain, dizziness, and seizures. How is this diagnosed? This condition is diagnosed by  measuring your blood pressure while you are seated, with your arm resting on a flat surface, your legs uncrossed, and your feet flat on the floor. The cuff of the blood pressure monitor will be placed directly against the skin of your upper arm at the level of your heart. Blood pressure should be measured at least twice using the same arm. Certain conditions can cause a difference in blood pressure between your right and left arms. If you have a high blood pressure reading during one visit or you have normal blood pressure with other risk factors, you may be asked to: Return on a different day to have your blood pressure checked again. Monitor your blood pressure at home for 1 week or longer. If you are diagnosed with hypertension, you may have other blood or imaging tests to help your health care provider understand your overall risk for other conditions. How is this treated? This condition is treated by making healthy lifestyle changes, such as eating healthy foods, exercising more, and reducing your alcohol intake. You may be referred for counseling on a healthy diet and physical activity. Your health care provider may prescribe medicine if lifestyle changes are not enough to get your blood pressure under control and if: Your systolic blood pressure is above 130. Your diastolic blood pressure is above 80. Your personal target blood pressure may vary depending on your medical conditions, your age, and other factors. Follow these instructions at home: Eating and drinking  Eat a diet that is high in fiber and potassium, and low in sodium, added sugar, and fat. An example of this eating plan is called the DASH diet. DASH stands for Dietary Approaches to Stop Hypertension. To eat this way: Eat   plenty of fresh fruits and vegetables. Try to fill one half of your plate at each meal with fruits and vegetables. Eat whole grains, such as whole-wheat pasta, brown rice, or whole-grain bread. Fill about one  fourth of your plate with whole grains. Eat or drink low-fat dairy products, such as skim milk or low-fat yogurt. Avoid fatty cuts of meat, processed or cured meats, and poultry with skin. Fill about one fourth of your plate with lean proteins, such as fish, chicken without skin, beans, eggs, or tofu. Avoid pre-made and processed foods. These tend to be higher in sodium, added sugar, and fat. Reduce your daily sodium intake. Many people with hypertension should eat less than 1,500 mg of sodium a day. Do not drink alcohol if: Your health care provider tells you not to drink. You are pregnant, may be pregnant, or are planning to become pregnant. If you drink alcohol: Limit how much you have to: 0-1 drink a day for women. 0-2 drinks a day for men. Know how much alcohol is in your drink. In the [place] one drink equals one 12 oz bottle of beer (355 mL), one 5 oz glass of wine (148 mL), or one 1 oz glass of hard liquor (44 mL). Lifestyle  Work with your health care provider to maintain a healthy body weight or to lose weight. Ask what an ideal weight is for you. Get at least 30 minutes of exercise that causes your heart to beat faster (aerobic exercise) most days of the week. Activities may include walking, swimming, or biking. Include exercise to strengthen your muscles (resistance exercise), such as Pilates or lifting weights, as part of your weekly exercise routine. Try to do these types of exercises for 30 minutes at least 3 days a week. Do not use any products that contain nicotine or tobacco. These products include cigarettes, chewing tobacco, and vaping devices, such as e-cigarettes. If you need help quitting, ask your health care provider. Monitor your blood pressure at home as told by your health care provider. Keep all follow-up visits. This is important. Medicines Take over-the-counter and prescription medicines only as told by your health care provider. Follow directions carefully. Blood  pressure medicines must be taken as prescribed. Do not skip doses of blood pressure medicine. Doing this puts you at risk for problems and can make the medicine less effective. Ask your health care provider about side effects or reactions to medicines that you should watch for. Contact a health care provider if you: Think you are having a reaction to a medicine you are taking. Have headaches that keep coming back (recurring). Feel dizzy. Have swelling in your ankles. Have trouble with your vision. Get help right away if you: Develop a severe headache or confusion. Have unusual weakness or numbness. Feel faint. Have severe pain in your chest or abdomen. Vomit repeatedly. Have trouble breathing. These symptoms may be an emergency. Get help right away. Call 911. Do not wait to see if the symptoms will go away. Do not drive yourself to the hospital. Summary Hypertension is when the force of blood pumping through your arteries is too strong. If this condition is not controlled, it may put you at risk for serious complications. Your personal target blood pressure may vary depending on your medical conditions, your age, and other factors. For most people, a normal blood pressure is less than 120/80. Hypertension is treated with lifestyle changes, medicines, or a combination of both. Lifestyle changes include losing weight, eating a healthy,   low-sodium diet, exercising more, and limiting alcohol. This information is not intended to replace advice given to you by your health care provider. Make sure you discuss any questions you have with your health care provider. Document Revised: 03/05/2021 Document Reviewed: 03/05/2021 Elsevier Patient Education  2024 [employer]  

## 2023-04-15 NOTE — Progress Notes (Signed)
Subjective:  Patient ID: Stephanie Calderon, female    DOB: Apr 02, 1951  Age: 72 y.o. MRN: [id number]  CC: Hypertension and Anemia   HPI [name] presents for f/up ----  Discussed the use of AI scribe software for clinical note transcription with the patient, who gave verbal consent to proceed.  History of Present Illness   The patient, a 72 year old individual with a history of hypertension, presents for a routine follow-up. She reports feeling well, with no symptoms of high or low blood pressure. She continues to work part-time and engage in regular walking, without experiencing chest pain, shortness of breath, dizziness, lightheadedness, or fatigue. She denies any symptoms suggestive of angina or heart failure.  The patient's blood pressure was checked during the visit and was found to be 138/86, which is an improvement from the previous reading.       Outpatient Medications Prior to Visit  Medication Sig Dispense Refill   aspirin EC 81 MG tablet Take 81 mg by mouth daily.     cyanocobalamin (VITAMIN B12) 1000 MCG tablet Take 1 tablet (1,000 mcg total) by mouth daily. 90 tablet 1   ferrous sulfate 325 (65 FE) MG tablet Take 325 mg by mouth daily with breakfast.     rosuvastatin (CRESTOR) 5 MG tablet TAKE 1 TABLET (5 MG TOTAL) BY MOUTH DAILY. 90 tablet 0   thiamine (VITAMIN B-1) 100 MG tablet Take 1 tablet (100 mg total) by mouth daily. 90 tablet 1   triamterene-hydrochlorothiazide (DYAZIDE) 37.5-25 MG capsule Take 1 each (1 capsule total) by mouth daily. 30 capsule 0   No facility-administered medications prior to visit.    ROS Review of Systems  Constitutional:  Negative for chills, diaphoresis, fatigue and fever.  HENT: Negative.    Respiratory: Negative.  Negative for chest tightness, shortness of breath and wheezing.   Cardiovascular:  Negative for chest pain, palpitations and leg swelling.  Gastrointestinal:  Negative for abdominal pain, constipation, diarrhea, nausea and  vomiting.  Genitourinary: Negative.  Negative for difficulty urinating, dysuria and hematuria.  Musculoskeletal: Negative.  Negative for arthralgias and myalgias.  Skin: Negative.  Negative for pallor and rash.  Neurological:  Negative for dizziness, weakness and light-headedness.  Hematological:  Negative for adenopathy. Does not bruise/bleed easily.  Psychiatric/Behavioral: Negative.      Objective:  BP 138/86 (BP Location: Left Arm, Patient Position: Sitting, Cuff Size: Normal)   Pulse 67   Temp 97.8 F (36.6 C) (Oral)   Resp 16   Ht 5\' 7"  (1.702 m)   Wt 151 lb 9.6 oz (68.8 kg)   SpO2 97%   BMI 23.74 kg/m   BP Readings from Last 3 Encounters:  04/15/23 138/86  09/16/22 (!) 144/88  02/27/22 126/80    Wt Readings from Last 3 Encounters:  04/15/23 151 lb 9.6 oz (68.8 kg)  09/16/22 151 lb (68.5 kg)  07/09/22 150 lb (68 kg)    Physical Exam Vitals reviewed.  Constitutional:      Appearance: Normal appearance.  HENT:     Mouth/Throat:     Mouth: Mucous membranes are moist.  Eyes:     General: No scleral icterus.    Conjunctiva/sclera: Conjunctivae normal.  Cardiovascular:     Rate and Rhythm: Normal rate and regular rhythm.     Heart sounds: No murmur heard.    No friction rub. No gallop.  Pulmonary:     Effort: Pulmonary effort is normal.     Breath sounds: No stridor. No wheezing, rhonchi  or rales.  Abdominal:     General: Abdomen is flat.     Palpations: There is no mass.     Tenderness: There is no abdominal tenderness. There is no guarding.     Hernia: No hernia is present.  Musculoskeletal:        General: Normal range of motion.     Cervical back: Neck supple.     Right lower leg: No edema.     Left lower leg: No edema.  Lymphadenopathy:     Cervical: No cervical adenopathy.  Skin:    General: Skin is warm and dry.  Neurological:     General: No focal deficit present.     Mental Status: She is alert. Mental status is at baseline.  Psychiatric:         Mood and Affect: Mood normal.        Behavior: Behavior normal.     Lab Results  Component Value Date   WBC 7.3 04/15/2023   HGB 12.1 04/15/2023   HCT 38.2 04/15/2023   PLT 295.0 04/15/2023   GLUCOSE 94 04/15/2023   CHOL 193 09/16/2022   TRIG 158.0 (H) 09/16/2022   HDL 87.80 09/16/2022   LDLDIRECT 81.0 07/24/2015   LDLCALC 74 09/16/2022   ALT 19 09/16/2022   AST 25 09/16/2022   NA 140 04/15/2023   K 4.4 04/15/2023   CL 104 04/15/2023   CREATININE 0.79 04/15/2023   BUN 19 04/15/2023   CO2 30 04/15/2023   TSH 2.72 09/16/2022    MM 3D SCREEN BREAST BILATERAL  Result Date: 06/27/2022 CLINICAL DATA:  Screening. EXAM: DIGITAL SCREENING BILATERAL MAMMOGRAM WITH TOMOSYNTHESIS AND CAD TECHNIQUE: Bilateral screening digital craniocaudal and mediolateral oblique mammograms were obtained. Bilateral screening digital breast tomosynthesis was performed. The images were evaluated with computer-aided detection. COMPARISON:  Previous exam(s). ACR Breast Density Category b: There are scattered areas of fibroglandular density. FINDINGS: There are no findings suspicious for malignancy. IMPRESSION: No mammographic evidence of malignancy. A result letter of this screening mammogram will be mailed directly to the patient. RECOMMENDATION: Screening mammogram in one year. (Code:SM-B-01Y) BI-RADS CATEGORY  1: Negative. Electronically Signed   By: Sherron Ales M.D.   On: 06/27/2022 17:38    Assessment & Plan:   Essential hypertension- Her BP is well controlled. -     Basic metabolic panel; Future -     CBC with Differential/Platelet; Future -     Urinalysis, Routine w reflex microscopic; Future -     Triamterene-HCTZ; Take 1 each (1 capsule total) by mouth daily.  Dispense: 90 capsule; Refill: 1  Chronic renal disease, stage 2, mildly decreased glomerular filtration rate (GFR) between 60-89 mL/min/1.73 square meter- Renal function is stable. -     Basic metabolic panel; Future -     Urinalysis,  Routine w reflex microscopic; Future  Anemia due to acquired thiamine deficiency -     CBC with Differential/Platelet; Future  Estrogen deficiency -     DG Bone Density; Future  Need for prophylactic vaccination with combined diphtheria-tetanus-pertussis (DTP) vaccine -     Boostrix; Inject 0.5 mLs into the muscle once for 1 dose.  Dispense: 0.5 mL; Refill: 0  Need for prophylactic vaccination and inoculation against varicella -     Shingrix; Inject 0.5 mLs into the muscle once for 1 dose.  Dispense: 0.5 mL; Refill: 1  Screening for colon cancer -     Ambulatory referral to Gastroenterology     Follow-up: Return in  about 6 months (around 10/14/2023).  Sanda Linger, MD

## 2023-04-17 ENCOUNTER — Encounter: Payer: Self-pay | Admitting: Internal Medicine

## 2023-04-18 ENCOUNTER — Other Ambulatory Visit: Payer: Self-pay | Admitting: Internal Medicine

## 2023-04-18 DIAGNOSIS — I10 Essential (primary) hypertension: Secondary | ICD-10-CM

## 2023-04-18 MED ORDER — TRIAMTERENE-HCTZ 37.5-25 MG PO CAPS: 1.0000 | ORAL_CAPSULE | Freq: Every day | ORAL | 1 refills | Status: DC

## 2023-07-13 ENCOUNTER — Ambulatory Visit (INDEPENDENT_AMBULATORY_CARE_PROVIDER_SITE_OTHER): Payer: Medicare PPO

## 2023-07-13 VITALS — Ht 67.0 in | Wt 151.0 lb

## 2023-07-13 DIAGNOSIS — Z780 Asymptomatic menopausal state: Secondary | ICD-10-CM

## 2023-07-13 DIAGNOSIS — Z0000 Encounter for general adult medical examination without abnormal findings: Secondary | ICD-10-CM

## 2023-07-13 DIAGNOSIS — Z1211 Encounter for screening for malignant neoplasm of colon: Secondary | ICD-10-CM

## 2023-07-13 NOTE — Patient Instructions (Addendum)
 Ms. Surprenant , Thank you for taking time to come for your Medicare Wellness Visit. I appreciate your ongoing commitment to your health goals. Please review the following plan we discussed and let me know if I can assist you in the future.   Referrals/Orders/Follow-Ups/Clinician Recommendations: Aim for 30 minutes of exercise or brisk walking, 6-8 glasses of water, and 5 servings of fruits and vegetables each day.  Ordered a DEXA scan and Cologuard test today.  Educated and advised on getting the Tdap, COVID, and Shingles vaccines.  This is a list of the screening recommended for you and due dates:  Health Maintenance  Topic Date Due   Cologuard (Stool DNA test)  Never done   Zoster (Shingles) Vaccine (1 of 2) Never done   DEXA scan (bone density measurement)  Never done   DTaP/Tdap/Td vaccine (2 - Td or Tdap) 11/30/2022   COVID-19 Vaccine (1 - 2024-25 season) Never done   Mammogram  06/26/2024   Medicare Annual Wellness Visit  07/12/2024   Pneumonia Vaccine  Completed   Hepatitis C Screening  Completed   HPV Vaccine  Aged Out   Flu Shot  Discontinued    Advanced directives: (Provided) Advance directive discussed with you today. I have provided a copy for you to complete at home and have notarized. Once this is complete, please bring a copy in to our office so we can scan it into your chart.   Next Medicare Annual Wellness Visit scheduled for next year: Yes - 07/2024

## 2023-07-13 NOTE — Progress Notes (Signed)
 Subjective:   Stephanie Calderon is a 73 y.o. who presents for a Medicare Wellness preventive visit.  Visit Complete: Virtual I connected with  Merie Fontenette on 07/13/23 by a audio enabled telemedicine application and verified that I am speaking with the correct person using two identifiers.  Patient Location: Home  Provider Location: Office/Clinic  I discussed the limitations of evaluation and management by telemedicine. The patient expressed understanding and agreed to proceed.  Vital Signs: Because this visit was a virtual/telehealth visit, some criteria may be missing or patient reported. Any vitals not documented were not able to be obtained and vitals that have been documented are patient reported.  VideoDeclined- This patient declined Librarian, academic. Therefore the visit was completed with audio only.  AWV Questionnaire: No: Patient Medicare AWV questionnaire was not completed prior to this visit.  Cardiac Risk Factors include: advanced age (>68men, >75 women);dyslipidemia;hypertension     Objective:    Today's Vitals   07/13/23 1044  Weight: 151 lb (68.5 kg)  Height: 5\' 7"  (1.702 m)   Body mass index is 23.65 kg/m.     07/13/2023   10:43 AM 07/09/2022   10:41 AM 09/20/2018    2:13 PM  Advanced Directives  Does Patient Have a Medical Advance Directive? No No No  Would patient like information on creating a medical advance directive? Yes (MAU/Ambulatory/Procedural Areas - Information given) No - Patient declined Yes (ED - Information included in AVS)    Current Medications (verified) Outpatient Encounter Medications as of 07/13/2023  Medication Sig   aspirin EC 81 MG tablet Take 81 mg by mouth daily.   cyanocobalamin (VITAMIN B12) 1000 MCG tablet Take 1 tablet (1,000 mcg total) by mouth daily.   ferrous sulfate 325 (65 FE) MG tablet Take 325 mg by mouth daily with breakfast.   rosuvastatin (CRESTOR) 5 MG tablet TAKE 1 TABLET (5 MG TOTAL) BY  MOUTH DAILY.   thiamine (VITAMIN B-1) 100 MG tablet Take 1 tablet (100 mg total) by mouth daily.   triamterene-hydrochlorothiazide (DYAZIDE) 37.5-25 MG capsule Take 1 each (1 capsule total) by mouth daily.   No facility-administered encounter medications on file as of 07/13/2023.    Allergies (verified) Patient has no known allergies.   History: Past Medical History:  Diagnosis Date   DJD (degenerative joint disease) of knee 11/29/2012   DOE (dyspnea on exertion) 07/24/2015   Essential hypertension 07/24/2015   Routine general medical examination at a health care facility 11/29/2012   Screen for colon cancer 07/24/2015   Past Surgical History:  Procedure Laterality Date   TUBAL LIGATION     WISDOM TOOTH EXTRACTION     Family History  Problem Relation Age of Onset   Heart disease Sister    Heart disease Brother    Alcohol abuse Neg Hx    Cancer Neg Hx    COPD Neg Hx    Diabetes Neg Hx    Early death Neg Hx    Drug abuse Neg Hx    Hyperlipidemia Neg Hx    Hypertension Neg Hx    Kidney disease Neg Hx    Stroke Neg Hx    Social History   Socioeconomic History   Marital status: Single    Spouse name: Not on file   Number of children: 2   Years of education: Not on file   Highest education level: Not on file  Occupational History   Occupation: part time at bank  Tobacco Use  Smoking status: Never    Passive exposure: Never   Smokeless tobacco: Never  Vaping Use   Vaping status: Never Used  Substance and Sexual Activity   Alcohol use: Not Currently   Drug use: No   Sexual activity: Not Currently  Other Topics Concern   Not on file  Social History Narrative   Not on file   Social Drivers of Health   Financial Resource Strain: Low Risk  (07/13/2023)   Overall Financial Resource Strain (CARDIA)    Difficulty of Paying Living Expenses: Not hard at all  Food Insecurity: No Food Insecurity (07/13/2023)   Hunger Vital Sign    Worried About Running Out of Food in the  Last Year: Never true    Ran Out of Food in the Last Year: Never true  Transportation Needs: No Transportation Needs (07/13/2023)   PRAPARE - Administrator, Civil Service (Medical): No    Lack of Transportation (Non-Medical): No  Physical Activity: Sufficiently Active (07/13/2023)   Exercise Vital Sign    Days of Exercise per Week: 6 days    Minutes of Exercise per Session: 30 min  Stress: No Stress Concern Present (07/13/2023)   [employer] of Occupational Health - Occupational Stress Questionnaire    Feeling of Stress : Not at all  Social Connections: Moderately Isolated (07/13/2023)   Social Connection and Isolation Panel [NHANES]    Frequency of Communication with Friends and Family: More than three times a week    Frequency of Social Gatherings with Friends and Family: More than three times a week    Attends Religious Services: More than 4 times per year    Active Member of [employer] or Organizations: No    Attends Banker Meetings: Never    Marital Status: Never married    Tobacco Counseling - Non Smoker Counseling given - N/A   Clinical Intake: Completed 07/13/2023  Pre-visit preparation completed: Yes  Pain : No/denies pain     BMI - recorded: 23.65 Nutritional Status: BMI of 19-24  Normal Nutritional Risks: None Diabetes: No  How often do you need to have someone help you when you read instructions, pamphlets, or other written materials from your doctor or pharmacy?: 1 - Never  Interpreter Needed?: No  Information entered by :: Hassell Halim, CMA   Activities of Daily Living Completed 07/13/2023    07/13/2023   10:46 AM  In your present state of health, do you have any difficulty performing the following activities:  Hearing? 0  Vision? 0  Difficulty concentrating or making decisions? 0  Walking or climbing stairs? 0  Dressing or bathing? 0  Doing errands, shopping? 0  Preparing Food and eating ? N  Using the Toilet? N  In the past six  months, have you accidently leaked urine? N  Do you have problems with loss of bowel control? N  Managing your Medications? N  Managing your Finances? N  Housekeeping or managing your Housekeeping? N    Patient Care Team: Etta Grandchild, MD as PCP - General (Internal Medicine)  Indicate any recent Medical Services you may have received from other than [facility] providers in the past year (date may be approximate).     Assessment:   This is a routine wellness examination for Stephanie Calderon.  Hearing/Vision screen Hearing Screening - Comments:: Denies hearing difficulties   Vision Screening - Comments:: No difficulty with vision   Goals Addressed  This Visit's Progress     Patient Stated (pt-stated)        Patient stated plans to stay active with exercising.       Depression Screen Completed 07/13/2023    07/13/2023   10:51 AM 07/09/2022   10:39 AM 08/12/2021    1:25 PM 06/19/2020    2:14 PM 05/23/2019    3:27 PM 09/20/2018    2:13 PM 09/14/2017    4:54 PM  PHQ 2/9 Scores  PHQ - 2 Score 0 0 0 0 0 0 0  PHQ- 9 Score 0          Fall Risk Completed 07/13/2023    07/13/2023   10:47 AM 07/09/2022   10:34 AM 08/12/2021    1:25 PM 06/19/2020    2:13 PM 05/23/2019    3:27 PM  Fall Risk   Falls in the past year? 0 0 0 0 0  Number falls in past yr: 0 0   0  Injury with Fall? 0 0   0  Risk for fall due to : No Fall Risks No Fall Risks     Follow up Falls prevention discussed;Falls evaluation completed Falls prevention discussed   Falls evaluation completed    MEDICARE RISK AT HOME: Completed 07/13/2023 Medicare Risk at Home Any stairs in or around the home?: No If so, are there any without handrails?: No Home free of loose throw rugs in walkways, pet beds, electrical cords, etc?: Yes Adequate lighting in your home to reduce risk of falls?: Yes Life alert?: No Use of a cane, walker or w/c?: No Grab bars in the bathroom?: No Shower chair or bench in shower?: No Elevated toilet seat  or a handicapped toilet?: No  TIMED UP AND GO:  Was the test performed?  No  Cognitive Function: 6CIT completed        07/13/2023   10:47 AM 07/09/2022   10:41 AM  6CIT Screen  What Year? 0 points 0 points  What month? 0 points 0 points  What time? 0 points 0 points  Count back from 20 2 points 0 points  Months in reverse 0 points 0 points  Repeat phrase 0 points 2 points  Total Score 2 points 2 points    Immunizations Immunization History  Administered Date(s) Administered   Pneumococcal Conjugate-13 09/14/2017   Pneumococcal Polysaccharide-23 11/29/2012, 05/23/2019   Tdap 11/29/2012    Screening Tests Health Maintenance  Topic Date Due   Fecal DNA (Cologuard)  Never done   Zoster Vaccines- Shingrix (1 of 2) Never done   DEXA SCAN  Never done   DTaP/Tdap/Td (2 - Td or Tdap) 11/30/2022   COVID-19 Vaccine (1 - 2024-25 season) Never done   MAMMOGRAM  06/26/2024   Medicare Annual Wellness (AWV)  07/12/2024   Pneumonia Vaccine 67+ Years old  Completed   Hepatitis C Screening  Completed   HPV VACCINES  Aged Out   INFLUENZA VACCINE  Discontinued    Health Maintenance  Health Maintenance Due  Topic Date Due   Fecal DNA (Cologuard)  Never done   Zoster Vaccines- Shingrix (1 of 2) Never done   DEXA SCAN  Never done   DTaP/Tdap/Td (2 - Td or Tdap) 11/30/2022   COVID-19 Vaccine (1 - 2024-25 season) Never done   Health Maintenance Items Addressed:07/13/2023 - pt declined the Tdap, COVID, and Shingles vaccines.    DEXA ordered, Cologuard Ordered on 07/13/2023  Additional Screening:  Vision Screening: Recommended annual ophthalmology exams for  early detection of glaucoma and other disorders of the eye.  Dental Screening: Recommended annual dental exams for proper oral hygiene  Community Resource Referral / Chronic Care Management: CRR required this visit?  No   CCM required this visit?  No     Plan:     I have personally reviewed and noted the following in the  patient's chart:   Medical and social history Use of alcohol, tobacco or illicit drugs  Current medications and supplements including opioid prescriptions. Patient is not currently taking opioid prescriptions. Functional ability and status Nutritional status Physical activity Advanced directives List of other physicians Hospitalizations, surgeries, and ER visits in previous 12 months Vitals Screenings to include cognitive, depression, and falls Referrals and appointments  In addition, I have reviewed and discussed with patient certain preventive protocols, quality metrics, and best practice recommendations. A written personalized care plan for preventive services as well as general preventive health recommendations were provided to patient.     Darreld Mclean, CMA   07/13/2023   After Visit Summary: (Mail) Due to this being a telephonic visit, the after visit summary with patients personalized plan was offered to patient via mail   Notes: Please refer to Routing Comments.

## 2023-07-23 ENCOUNTER — Other Ambulatory Visit: Payer: Self-pay | Admitting: Internal Medicine

## 2023-07-23 DIAGNOSIS — E785 Hyperlipidemia, unspecified: Secondary | ICD-10-CM

## 2023-10-23 ENCOUNTER — Telehealth: Payer: Self-pay | Admitting: Internal Medicine

## 2023-10-23 DIAGNOSIS — E785 Hyperlipidemia, unspecified: Secondary | ICD-10-CM

## 2023-10-26 NOTE — Telephone Encounter (Signed)
 Copied from CRM [id number]. Topic: Clinical - Medication Refill >> Oct 26, 2023 12:25 PM Emmet Harm C wrote: Medication: triamterene -hydrochlorothiazide  (DYAZIDE) 37.5-25 MG capsule     Has the patient contacted their pharmacy? Yes (Agent: If no, request that the patient contact the pharmacy for the refill. If patient does not wish to contact the pharmacy document the reason why and proceed with request.) (Agent: If yes, when and what did the pharmacy advise?)  This is the patient's preferred pharmacy:  [id number] [facility], [place] - [address] [place] [address] Phone: [phone] Fax: [phone]  Is this the correct pharmacy for this prescription? Yes If no, delete pharmacy and type the correct one.   Has the prescription been filled recently? Yes  Is the patient out of the medication? Yes  Has the patient been seen for an appointment in the last year OR does the patient have an upcoming appointment? Yes  Can we respond through MyChart? No  Agent: Please be advised that Rx refills may take up to 3 business days. We ask that you follow-up with your pharmacy.

## 2023-10-30 NOTE — Telephone Encounter (Signed)
 Copied from CRM [id number]. Topic: Clinical - Medication Refill >> Oct 26, 2023 12:25 PM Emmet Harm C wrote: Medication: triamterene -hydrochlorothiazide  (DYAZIDE) 37.5-25 MG capsule     Has the patient contacted their pharmacy? Yes (Agent: If no, request that the patient contact the pharmacy for the refill. If patient does not wish to contact the pharmacy document the reason why and proceed with request.) (Agent: If yes, when and what did the pharmacy advise?)  This is the patient's preferred pharmacy:  [id number] [place] [facility], [place] - [address] [place] [address] Phone: [phone] Fax: [phone]  Is this the correct pharmacy for this prescription? Yes If no, delete pharmacy and type the correct one.   Has the prescription been filled recently? Yes  Is the patient out of the medication? Yes  Has the patient been seen for an appointment in the last year OR does the patient have an upcoming appointment? Yes  Can we respond through MyChart? No  Agent: Please be advised that Rx refills may take up to 3 business days. We ask that you follow-up with your pharmacy. >> Oct 30, 2023  2:46 PM Baldo Levan wrote: Patient is calling in regarding this medication, patient stated she is completely out of it and has not heard back.

## 2023-11-04 ENCOUNTER — Other Ambulatory Visit: Payer: Self-pay

## 2023-11-04 DIAGNOSIS — I10 Essential (primary) hypertension: Secondary | ICD-10-CM

## 2023-11-04 MED ORDER — TRIAMTERENE-HCTZ 37.5-25 MG PO CAPS: 1.0000 | ORAL_CAPSULE | Freq: Every day | ORAL | 1 refills | Status: DC

## 2023-11-12 ENCOUNTER — Ambulatory Visit: Admitting: Internal Medicine

## 2023-12-03 ENCOUNTER — Ambulatory Visit: Admitting: Internal Medicine

## 2023-12-22 ENCOUNTER — Encounter: Payer: Self-pay | Admitting: Internal Medicine

## 2023-12-22 ENCOUNTER — Ambulatory Visit: Admitting: Internal Medicine

## 2023-12-22 ENCOUNTER — Ambulatory Visit: Payer: Self-pay | Admitting: Internal Medicine

## 2023-12-22 VITALS — BP 142/82 | HR 65 | Temp 98.3°F | Resp 16 | Ht 67.0 in | Wt 151.0 lb

## 2023-12-22 DIAGNOSIS — E2839 Other primary ovarian failure: Secondary | ICD-10-CM

## 2023-12-22 DIAGNOSIS — Z0001 Encounter for general adult medical examination with abnormal findings: Secondary | ICD-10-CM | POA: Diagnosis not present

## 2023-12-22 DIAGNOSIS — E038 Other specified hypothyroidism: Secondary | ICD-10-CM

## 2023-12-22 DIAGNOSIS — I10 Essential (primary) hypertension: Secondary | ICD-10-CM

## 2023-12-22 DIAGNOSIS — I493 Ventricular premature depolarization: Secondary | ICD-10-CM | POA: Diagnosis not present

## 2023-12-22 DIAGNOSIS — E785 Hyperlipidemia, unspecified: Secondary | ICD-10-CM

## 2023-12-22 DIAGNOSIS — R9431 Abnormal electrocardiogram [ECG] [EKG]: Secondary | ICD-10-CM | POA: Insufficient documentation

## 2023-12-22 DIAGNOSIS — D538 Other specified nutritional anemias: Secondary | ICD-10-CM

## 2023-12-22 DIAGNOSIS — Z1211 Encounter for screening for malignant neoplasm of colon: Secondary | ICD-10-CM

## 2023-12-22 DIAGNOSIS — N182 Chronic kidney disease, stage 2 (mild): Secondary | ICD-10-CM

## 2023-12-22 DIAGNOSIS — E519 Thiamine deficiency, unspecified: Secondary | ICD-10-CM

## 2023-12-22 DIAGNOSIS — Z1231 Encounter for screening mammogram for malignant neoplasm of breast: Secondary | ICD-10-CM | POA: Insufficient documentation

## 2023-12-22 LAB — URINALYSIS, ROUTINE W REFLEX MICROSCOPIC
Bilirubin Urine: NEGATIVE
Hgb urine dipstick: NEGATIVE
Ketones, ur: NEGATIVE
Nitrite: NEGATIVE
RBC / HPF: NONE SEEN (ref 0–?)
Specific Gravity, Urine: <=1.005 — AB (ref 1.000–1.030)
Total Protein, Urine: NEGATIVE
Urine Glucose: NEGATIVE
Urobilinogen, UA: 0.2 (ref 0.0–1.0)
pH: 6 (ref 5.0–8.0)

## 2023-12-22 LAB — HEPATIC FUNCTION PANEL
ALT: 18 U/L (ref 0–35)
AST: 17 U/L (ref 0–37)
Albumin: 4.3 g/dL (ref 3.5–5.2)
Alkaline Phosphatase: 55 U/L (ref 39–117)
Bilirubin, Direct: 0.1 mg/dL (ref 0.0–0.3)
Total Bilirubin: 0.4 mg/dL (ref 0.2–1.2)
Total Protein: 7.4 g/dL (ref 6.0–8.3)

## 2023-12-22 LAB — CBC WITH DIFFERENTIAL/PLATELET
Basophils Absolute: 0 K/uL (ref 0.0–0.1)
Basophils Relative: 0.5 % (ref 0.0–3.0)
Eosinophils Absolute: 0.5 K/uL (ref 0.0–0.7)
Eosinophils Relative: 7.8 % — ABNORMAL HIGH (ref 0.0–5.0)
HCT: 36.9 % (ref 36.0–46.0)
Hemoglobin: 11.8 g/dL — ABNORMAL LOW (ref 12.0–15.0)
Lymphocytes Relative: 33.4 % (ref 12.0–46.0)
Lymphs Abs: 2.1 K/uL (ref 0.7–4.0)
MCHC: 32 g/dL (ref 30.0–36.0)
MCV: 74.8 fl — ABNORMAL LOW (ref 78.0–100.0)
Monocytes Absolute: 0.5 K/uL (ref 0.1–1.0)
Monocytes Relative: 7.2 % (ref 3.0–12.0)
Neutro Abs: 3.2 K/uL (ref 1.4–7.7)
Neutrophils Relative %: 51.1 % (ref 43.0–77.0)
Platelets: 246 K/uL (ref 150.0–400.0)
RBC: 4.93 Mil/uL (ref 3.87–5.11)
RDW: 16 % — ABNORMAL HIGH (ref 11.5–15.5)
WBC: 6.3 K/uL (ref 4.0–10.5)

## 2023-12-22 LAB — LIPID PANEL
Cholesterol: 167 mg/dL (ref 0–200)
HDL: 76.8 mg/dL (ref >39.00)
LDL Cholesterol: 72 mg/dL (ref 0–99)
NonHDL: 90.49
Total CHOL/HDL Ratio: 2
Triglycerides: 90 mg/dL (ref 0.0–149.0)
VLDL: 18 mg/dL (ref 0.0–40.0)

## 2023-12-22 LAB — BASIC METABOLIC PANEL WITH GFR
BUN: 19 mg/dL (ref 6–23)
CO2: 28 meq/L (ref 19–32)
Calcium: 9.4 mg/dL (ref 8.4–10.5)
Chloride: 103 meq/L (ref 96–112)
Creatinine, Ser: 0.75 mg/dL (ref 0.40–1.20)
GFR: 79.8 mL/min (ref >60.00)
Glucose, Bld: 103 mg/dL — ABNORMAL HIGH (ref 70–99)
Potassium: 3.9 meq/L (ref 3.5–5.1)
Sodium: 139 meq/L (ref 135–145)

## 2023-12-22 LAB — TSH: TSH: 3.69 u[IU]/mL (ref 0.35–5.50)

## 2023-12-22 MED ORDER — TRIAMTERENE-HCTZ 37.5-25 MG PO CAPS: 1.0000 | ORAL_CAPSULE | Freq: Every day | ORAL | 1 refills | Status: DC

## 2023-12-22 NOTE — Patient Instructions (Signed)

## 2023-12-22 NOTE — Progress Notes (Unsigned)
 Subjective:  Patient ID: Stephanie Calderon, female    DOB: 05-16-1950  Age: 73 y.o. MRN: [id number]  CC: Annual Exam, Hypertension, and Hyperlipidemia   HPI [name] presents for a CPX and f/up ----  Discussed the use of AI scribe software for clinical note transcription with the patient, who gave verbal consent to proceed.  History of Present Illness Stephanie Calderon is a 73 year old female who presents for a routine follow-up visit.  She reports feeling well overall and walks five to six days a week to her part-time job at [employer]. She has no headaches, blurred vision, chest pain, or shortness of breath.  She has been out of her blood pressure medication since June and has not taken it since then. She takes aspirin regularly, which she believes helps her condition.  She has not had a mammogram since last year and has never been screened for colon cancer. She is uncomfortable with the idea of fecal testing. She has not been vaccinated against shingles or received a tetanus booster.  She reports feeling her heart beating regularly and mentions experiencing extra beats, which she has had for a long time.    Outpatient Medications Prior to Visit  Medication Sig Dispense Refill   aspirin EC 81 MG tablet Take 81 mg by mouth daily.     rosuvastatin  (CRESTOR ) 5 MG tablet TAKE 1 TABLET (5 MG TOTAL) BY MOUTH DAILY. 90 tablet 0   triamterene -hydrochlorothiazide  (DYAZIDE) 37.5-25 MG capsule Take 1 each (1 capsule total) by mouth daily. 90 capsule 1   cyanocobalamin (VITAMIN B12) 1000 MCG tablet Take 1 tablet (1,000 mcg total) by mouth daily. 90 tablet 1   ferrous sulfate 325 (65 FE) MG tablet Take 325 mg by mouth daily with breakfast.     thiamine (VITAMIN B-1) 100 MG tablet Take 1 tablet (100 mg total) by mouth daily. 90 tablet 1   No facility-administered medications prior to visit.    ROS Review of Systems  Objective:  BP (!) 142/82 (BP Location: Left Arm, Patient Position:  Sitting, Cuff Size: Small)   Pulse 65   Temp 98.3 F (36.8 C) (Oral)   Resp 16   Ht 5' 7 (1.702 m)   Wt 151 lb (68.5 kg)   SpO2 97%   BMI 23.65 kg/m   BP Readings from Last 3 Encounters:  12/22/23 (!) 142/82  04/15/23 138/86  09/16/22 (!) 144/88    Wt Readings from Last 3 Encounters:  12/22/23 151 lb (68.5 kg)  07/13/23 151 lb (68.5 kg)  04/15/23 151 lb 9.6 oz (68.8 kg)    Physical Exam Vitals reviewed.  Constitutional:      Appearance: Normal appearance.  HENT:     Mouth/Throat:     Mouth: Mucous membranes are moist.  Eyes:     General: No scleral icterus.    Conjunctiva/sclera: Conjunctivae normal.  Cardiovascular:     Rate and Rhythm: Normal rate. Rhythm irregular.     Pulses: Normal pulses.     Heart sounds: No murmur heard.    No friction rub. No gallop.     Comments: EKG- SA (new), 80 bpm Prolonged QT No LVH or Q waves Pulmonary:     Effort: Pulmonary effort is normal.     Breath sounds: No stridor. No wheezing, rhonchi or rales.  Abdominal:     General: Abdomen is flat.     Palpations: There is no mass.     Tenderness: There is no abdominal tenderness.  There is no guarding.     Hernia: No hernia is present.  Musculoskeletal:     Cervical back: Neck supple.     Right lower leg: No edema.     Left lower leg: No edema.  Lymphadenopathy:     Cervical: No cervical adenopathy.  Skin:    General: Skin is warm and dry.     Findings: No rash.  Neurological:     General: No focal deficit present.     Mental Status: She is alert.  Psychiatric:        Mood and Affect: Mood normal.        Behavior: Behavior normal.     Lab Results  Component Value Date   WBC 6.3 12/22/2023   HGB 11.8 (L) 12/22/2023   HCT 36.9 12/22/2023   PLT 246.0 12/22/2023   GLUCOSE 103 (H) 12/22/2023   CHOL 167 12/22/2023   TRIG 90.0 12/22/2023   HDL 76.80 12/22/2023   LDLDIRECT 81.0 07/24/2015   LDLCALC 72 12/22/2023   ALT 18 12/22/2023   AST 17 12/22/2023   NA 139  12/22/2023   K 3.9 12/22/2023   CL 103 12/22/2023   CREATININE 0.75 12/22/2023   BUN 19 12/22/2023   CO2 28 12/22/2023   TSH 3.69 12/22/2023    MM 3D SCREEN BREAST BILATERAL Result Date: 06/27/2022 CLINICAL DATA:  Screening. EXAM: DIGITAL SCREENING BILATERAL MAMMOGRAM WITH TOMOSYNTHESIS AND CAD TECHNIQUE: Bilateral screening digital craniocaudal and mediolateral oblique mammograms were obtained. Bilateral screening digital breast tomosynthesis was performed. The images were evaluated with computer-aided detection. COMPARISON:  Previous exam(s). ACR Breast Density Category b: There are scattered areas of fibroglandular density. FINDINGS: There are no findings suspicious for malignancy. IMPRESSION: No mammographic evidence of malignancy. A result letter of this screening mammogram will be mailed directly to the patient. RECOMMENDATION: Screening mammogram in one year. (Code:SM-B-01Y) BI-RADS CATEGORY  1: Negative. Electronically Signed   By: Leita Mattocks M.D.   On: 06/27/2022 17:38    Assessment & Plan:   Encounter for general adult medical examination with abnormal findings- Exam completed, labs reviewed, vaccines reviewed, cancer screenings addressed, pt ed material was given.   Essential hypertension- Her BP is well controlled. -     EKG 12-Lead -     Urinalysis, Routine w reflex microscopic; Future -     Basic metabolic panel with GFR; Future -     CBC with Differential/Platelet; Future -     Triamterene -HCTZ; Take 1 each (1 capsule total) by mouth daily.  Dispense: 90 capsule; Refill: 1  Subclinical hypothyroidism -     TSH; Future  Asymptomatic PVCs -     EKG 12-Lead  Estrogen deficiency -     DG Bone Density; Future  Chronic renal disease, stage 2, mildly decreased glomerular filtration rate (GFR) between 60-89 mL/min/1.73 square meter- Will avoid nephrotoxic agents   -     Urinalysis, Routine w reflex microscopic; Future -     Basic metabolic panel with GFR; Future -     CBC  with Differential/Platelet; Future  Screening for colon cancer -     Ambulatory referral to Gastroenterology  Screening mammogram for breast cancer -     3D Screening Mammogram, Left and Right; Future  Anemia due to acquired thiamine deficiency -     CBC with Differential/Platelet; Future  Hyperlipidemia LDL goal <130- LDL goal achieved. Doing well on the statin  -     Lipid panel; Future -  TSH; Future -     Hepatic function panel; Future -     Rosuvastatin  Calcium ; Take 1 tablet (5 mg total) by mouth daily.  Dispense: 90 tablet; Refill: 1     Follow-up: Return in about 6 months (around 06/23/2024).  Debby Molt, MD

## 2023-12-29 MED ORDER — TRIAMTERENE-HCTZ 37.5-25 MG PO CAPS: 1.0000 | ORAL_CAPSULE | Freq: Every day | ORAL | 1 refills | Status: AC

## 2023-12-29 MED ORDER — ROSUVASTATIN CALCIUM 5 MG PO TABS: 5.0000 mg | ORAL_TABLET | Freq: Every day | ORAL | 1 refills | Status: AC

## 2024-01-08 ENCOUNTER — Ambulatory Visit
Admission: RE | Admit: 2024-01-08 | Discharge: 2024-01-08 | Disposition: A | Discharge status: Home / Self Care | Source: Ambulatory Visit | Attending: Internal Medicine | Admitting: Internal Medicine

## 2024-01-08 DIAGNOSIS — Z1231 Encounter for screening mammogram for malignant neoplasm of breast: Secondary | ICD-10-CM

## 2024-02-16 ENCOUNTER — Other Ambulatory Visit: Payer: Self-pay | Admitting: Internal Medicine

## 2024-02-16 DIAGNOSIS — Z1211 Encounter for screening for malignant neoplasm of colon: Secondary | ICD-10-CM

## 2024-05-19 ENCOUNTER — Telehealth: Payer: Self-pay | Admitting: Pharmacy Technician

## 2024-05-19 NOTE — Progress Notes (Signed)
" ° °  05/19/2024  Patient ID: Stephanie Calderon, female   DOB: 11-30-1950, 74 y.o.   MRN: [id number]  Pharmacy Quality Measure Review  This patient failed   the 2025 adherence measure for cholesterol (statin) medications this calendar year.   Medication: Rosuvastatin   Last fill date: 10/27/2023 for 90 day supply And previous fill was on3/13/2025 for 90 day supply.  Patient was outreached. HIPAA verified. Patient informs she aims to take Rosuvastatin  daily but sometimes she skips a day. She feels as though she does not need the medication. Informed patient medication was used for cholesterol and that her doctor wants her to take 1 whole pill once a day around the same time each day. Informed her this it to keep her cholesterol down and protect her heart. Patient informs she currently has about 200 tablets. Patient verbalized understanding of the need to take daily.  Nitish Roes, CPhT [facility] Office: [phone] Email: [email or web address]   "

## 2024-06-03 ENCOUNTER — Encounter: Payer: Self-pay | Admitting: Gastroenterology

## 2024-08-08 ENCOUNTER — Ambulatory Visit
